# Patient Record
Sex: Female | Born: 1975 | Race: Black or African American | Hispanic: No | Marital: Married | Smoking: Never smoker
Health system: Southern US, Community
[De-identification: ages and names within clinical notes are randomized; demographics above are authoritative.]

## PROBLEM LIST (undated history)

## (undated) ENCOUNTER — Ambulatory Visit (HOSPITAL_COMMUNITY): Admission: EM | Discharge status: Absent without leave | Payer: Self-pay

## (undated) ENCOUNTER — Emergency Department (HOSPITAL_COMMUNITY): Admission: EM | Payer: Self-pay

## (undated) DIAGNOSIS — D649 Anemia, unspecified: Secondary | ICD-10-CM

## (undated) DIAGNOSIS — I10 Essential (primary) hypertension: Secondary | ICD-10-CM

## (undated) HISTORY — PX: TONSILLECTOMY: SUR1361

## (undated) HISTORY — PX: WISDOM TOOTH EXTRACTION: SHX21

## (undated) HISTORY — PX: TUBAL LIGATION: SHX77

---

## 1999-12-04 ENCOUNTER — Inpatient Hospital Stay (HOSPITAL_COMMUNITY): Admission: RE | Admit: 1999-12-04 | Discharge: 1999-12-12 | Payer: Self-pay | Admitting: *Deleted

## 1999-12-07 ENCOUNTER — Encounter: Payer: Self-pay | Admitting: *Deleted

## 1999-12-19 ENCOUNTER — Inpatient Hospital Stay (HOSPITAL_COMMUNITY): Admission: AD | Admit: 1999-12-19 | Discharge: 1999-12-27 | Payer: Self-pay | Admitting: *Deleted

## 2000-01-09 ENCOUNTER — Encounter: Payer: Self-pay | Admitting: *Deleted

## 2000-01-09 ENCOUNTER — Encounter (HOSPITAL_COMMUNITY): Admission: RE | Admit: 2000-01-09 | Discharge: 2000-03-15 | Payer: Self-pay | Admitting: *Deleted

## 2000-01-30 ENCOUNTER — Inpatient Hospital Stay (HOSPITAL_COMMUNITY): Admission: AD | Admit: 2000-01-30 | Discharge: 2000-02-06 | Payer: Self-pay | Admitting: *Deleted

## 2000-01-31 ENCOUNTER — Encounter: Payer: Self-pay | Admitting: *Deleted

## 2000-03-15 ENCOUNTER — Inpatient Hospital Stay (HOSPITAL_COMMUNITY): Admission: AD | Admit: 2000-03-15 | Discharge: 2000-04-01 | Payer: Self-pay | Admitting: Obstetrics & Gynecology

## 2000-03-15 ENCOUNTER — Encounter: Payer: Self-pay | Admitting: Obstetrics & Gynecology

## 2000-03-15 ENCOUNTER — Encounter (INDEPENDENT_AMBULATORY_CARE_PROVIDER_SITE_OTHER): Payer: Self-pay | Admitting: Specialist

## 2000-03-16 ENCOUNTER — Encounter: Payer: Self-pay | Admitting: *Deleted

## 2000-03-20 ENCOUNTER — Encounter: Payer: Self-pay | Admitting: *Deleted

## 2000-05-23 ENCOUNTER — Emergency Department (HOSPITAL_COMMUNITY): Admission: EM | Admit: 2000-05-23 | Discharge: 2000-05-23 | Payer: Self-pay | Admitting: Emergency Medicine

## 2000-06-16 ENCOUNTER — Emergency Department (HOSPITAL_COMMUNITY): Admission: EM | Admit: 2000-06-16 | Discharge: 2000-06-17 | Payer: Self-pay | Admitting: Emergency Medicine

## 2000-09-29 ENCOUNTER — Emergency Department (HOSPITAL_COMMUNITY): Admission: EM | Admit: 2000-09-29 | Discharge: 2000-09-30 | Payer: Self-pay | Admitting: Emergency Medicine

## 2000-11-11 ENCOUNTER — Encounter: Admission: RE | Admit: 2000-11-11 | Discharge: 2000-11-11 | Payer: Self-pay | Admitting: Family Medicine

## 2001-02-23 ENCOUNTER — Emergency Department (HOSPITAL_COMMUNITY): Admission: EM | Admit: 2001-02-23 | Discharge: 2001-02-23 | Payer: Self-pay | Admitting: Physical Therapy

## 2002-02-12 ENCOUNTER — Ambulatory Visit (HOSPITAL_COMMUNITY): Admission: RE | Admit: 2002-02-12 | Discharge: 2002-02-12 | Payer: Self-pay | Admitting: *Deleted

## 2002-03-28 ENCOUNTER — Inpatient Hospital Stay (HOSPITAL_COMMUNITY): Admission: AD | Admit: 2002-03-28 | Discharge: 2002-03-30 | Payer: Self-pay | Admitting: *Deleted

## 2003-03-30 ENCOUNTER — Emergency Department (HOSPITAL_COMMUNITY): Admission: EM | Admit: 2003-03-30 | Discharge: 2003-03-30 | Payer: Self-pay

## 2004-06-25 ENCOUNTER — Emergency Department (HOSPITAL_COMMUNITY): Admission: EM | Admit: 2004-06-25 | Discharge: 2004-06-25 | Payer: Self-pay

## 2006-08-14 ENCOUNTER — Ambulatory Visit (HOSPITAL_COMMUNITY): Admission: RE | Admit: 2006-08-14 | Discharge: 2006-08-14 | Payer: Self-pay | Admitting: Obstetrics and Gynecology

## 2006-08-28 ENCOUNTER — Ambulatory Visit (HOSPITAL_COMMUNITY): Admission: RE | Admit: 2006-08-28 | Discharge: 2006-08-28 | Payer: Self-pay | Admitting: Obstetrics and Gynecology

## 2006-09-21 ENCOUNTER — Emergency Department (HOSPITAL_COMMUNITY): Admission: EM | Admit: 2006-09-21 | Discharge: 2006-09-21 | Payer: Self-pay | Admitting: Emergency Medicine

## 2007-01-23 ENCOUNTER — Inpatient Hospital Stay (HOSPITAL_COMMUNITY): Admission: AD | Admit: 2007-01-23 | Discharge: 2007-01-25 | Payer: Self-pay | Admitting: Obstetrics and Gynecology

## 2007-01-23 ENCOUNTER — Ambulatory Visit: Payer: Self-pay | Admitting: *Deleted

## 2009-08-01 ENCOUNTER — Ambulatory Visit (HOSPITAL_COMMUNITY): Admission: RE | Admit: 2009-08-01 | Discharge: 2009-08-01 | Payer: Self-pay | Admitting: *Deleted

## 2009-08-25 ENCOUNTER — Ambulatory Visit: Payer: Self-pay | Admitting: Obstetrics & Gynecology

## 2009-09-06 ENCOUNTER — Ambulatory Visit (HOSPITAL_COMMUNITY): Admission: RE | Admit: 2009-09-06 | Discharge: 2009-09-06 | Payer: Self-pay | Admitting: Obstetrics & Gynecology

## 2009-09-09 ENCOUNTER — Ambulatory Visit: Payer: Self-pay | Admitting: Obstetrics & Gynecology

## 2009-10-13 ENCOUNTER — Ambulatory Visit: Payer: Self-pay | Admitting: Obstetrics & Gynecology

## 2009-10-18 ENCOUNTER — Ambulatory Visit (HOSPITAL_COMMUNITY): Admission: RE | Admit: 2009-10-18 | Discharge: 2009-10-18 | Payer: Self-pay | Admitting: Family Medicine

## 2009-11-10 ENCOUNTER — Ambulatory Visit (HOSPITAL_COMMUNITY): Admission: RE | Admit: 2009-11-10 | Discharge: 2009-11-10 | Payer: Self-pay | Admitting: Family Medicine

## 2009-12-14 ENCOUNTER — Ambulatory Visit (HOSPITAL_COMMUNITY): Admission: RE | Admit: 2009-12-14 | Discharge: 2009-12-14 | Payer: Self-pay | Admitting: Family Medicine

## 2010-01-04 ENCOUNTER — Ambulatory Visit (HOSPITAL_COMMUNITY): Admission: RE | Admit: 2010-01-04 | Discharge: 2010-01-04 | Payer: Self-pay | Admitting: Family Medicine

## 2010-01-09 ENCOUNTER — Ambulatory Visit (HOSPITAL_COMMUNITY): Admission: RE | Admit: 2010-01-09 | Discharge: 2010-01-09 | Payer: Self-pay | Admitting: Family Medicine

## 2010-01-12 ENCOUNTER — Ambulatory Visit (HOSPITAL_COMMUNITY): Admission: RE | Admit: 2010-01-12 | Discharge: 2010-01-12 | Payer: Self-pay | Admitting: Family Medicine

## 2010-01-16 ENCOUNTER — Ambulatory Visit (HOSPITAL_COMMUNITY): Admission: RE | Admit: 2010-01-16 | Discharge: 2010-01-16 | Payer: Self-pay | Admitting: Obstetrics

## 2010-01-19 ENCOUNTER — Ambulatory Visit (HOSPITAL_COMMUNITY): Admission: RE | Admit: 2010-01-19 | Discharge: 2010-01-19 | Payer: Self-pay | Admitting: Obstetrics

## 2010-01-23 ENCOUNTER — Ambulatory Visit (HOSPITAL_COMMUNITY): Admission: RE | Admit: 2010-01-23 | Discharge: 2010-01-23 | Payer: Self-pay | Admitting: Obstetrics

## 2010-01-26 ENCOUNTER — Ambulatory Visit (HOSPITAL_COMMUNITY): Admission: RE | Admit: 2010-01-26 | Discharge: 2010-01-26 | Payer: Self-pay | Admitting: Obstetrics

## 2010-01-30 ENCOUNTER — Ambulatory Visit (HOSPITAL_COMMUNITY): Admission: RE | Admit: 2010-01-30 | Discharge: 2010-01-30 | Payer: Self-pay | Admitting: Obstetrics

## 2010-02-02 ENCOUNTER — Encounter (INDEPENDENT_AMBULATORY_CARE_PROVIDER_SITE_OTHER): Payer: Self-pay | Admitting: Obstetrics

## 2010-02-02 ENCOUNTER — Inpatient Hospital Stay (HOSPITAL_COMMUNITY): Admission: RE | Admit: 2010-02-02 | Discharge: 2010-02-05 | Payer: Self-pay | Admitting: Obstetrics

## 2010-03-28 ENCOUNTER — Ambulatory Visit (HOSPITAL_COMMUNITY): Admission: RE | Admit: 2010-03-28 | Discharge: 2010-03-28 | Payer: Self-pay | Admitting: Obstetrics

## 2010-04-12 ENCOUNTER — Ambulatory Visit (HOSPITAL_COMMUNITY): Admission: RE | Admit: 2010-04-12 | Discharge: 2010-04-12 | Payer: Self-pay | Admitting: Obstetrics

## 2010-08-27 ENCOUNTER — Encounter: Payer: Self-pay | Admitting: Obstetrics

## 2010-08-27 ENCOUNTER — Encounter: Payer: Self-pay | Admitting: Family Medicine

## 2010-10-19 LAB — CBC
HCT: 39.9 % (ref 36.0–46.0)
MCHC: 33.1 g/dL (ref 30.0–36.0)
MCV: 83.5 fL (ref 78.0–100.0)

## 2010-10-19 LAB — CULTURE, ROUTINE-ABSCESS: Culture: NO GROWTH

## 2010-10-22 LAB — CBC
Platelets: 273 10*3/uL (ref 150–400)
RDW: 14.1 % (ref 11.5–15.5)
WBC: 9.6 10*3/uL (ref 4.0–10.5)

## 2010-10-23 LAB — POCT URINALYSIS DIP (DEVICE)
Glucose, UA: NEGATIVE mg/dL
Nitrite: NEGATIVE
pH: 5 (ref 5.0–8.0)

## 2010-10-23 LAB — SURGICAL PCR SCREEN: Staphylococcus aureus: NEGATIVE

## 2010-10-23 LAB — CBC
Hemoglobin: 13 g/dL (ref 12.0–15.0)
MCHC: 34 g/dL (ref 30.0–36.0)
RBC: 4.31 MIL/uL (ref 3.87–5.11)
WBC: 8.3 10*3/uL (ref 4.0–10.5)

## 2010-10-23 LAB — RPR: RPR Ser Ql: NONREACTIVE

## 2010-10-27 LAB — POCT URINALYSIS DIP (DEVICE)
Ketones, ur: NEGATIVE mg/dL
Protein, ur: 100 mg/dL — AB
Urobilinogen, UA: 0.2 mg/dL (ref 0.0–1.0)

## 2010-10-30 LAB — POCT URINALYSIS DIP (DEVICE)
Glucose, UA: NEGATIVE mg/dL
Nitrite: NEGATIVE
Specific Gravity, Urine: >=1.03 (ref 1.005–1.030)
Urobilinogen, UA: 0.2 mg/dL (ref 0.0–1.0)

## 2010-11-02 ENCOUNTER — Emergency Department (HOSPITAL_COMMUNITY)
Admission: EM | Admit: 2010-11-02 | Discharge: 2010-11-03 | Disposition: A | Discharge status: Home / Self Care | Payer: Medicaid Other | Attending: Emergency Medicine | Admitting: Emergency Medicine

## 2010-11-02 DIAGNOSIS — H9209 Otalgia, unspecified ear: Secondary | ICD-10-CM | POA: Insufficient documentation

## 2010-11-02 DIAGNOSIS — J020 Streptococcal pharyngitis: Secondary | ICD-10-CM | POA: Insufficient documentation

## 2010-11-02 DIAGNOSIS — R070 Pain in throat: Secondary | ICD-10-CM | POA: Insufficient documentation

## 2010-11-02 LAB — RAPID STREP SCREEN (MED CTR MEBANE ONLY): Streptococcus, Group A Screen (Direct): POSITIVE — AB

## 2010-11-23 IMAGING — US US OB LIMITED
1 series · 14 of 18 positions shown · non-contrast
Comparison: none

OBSTETRICAL ULTRASOUND:
 This ultrasound was performed in The [HOSPITAL], and the [facility] report will be stored to [REDACTED] PACS.  This report is also available in [HOSPITAL]?s accessANYware.

[Series 1: us ob limited · 14 of 18 slices shown]
[im 1/18]
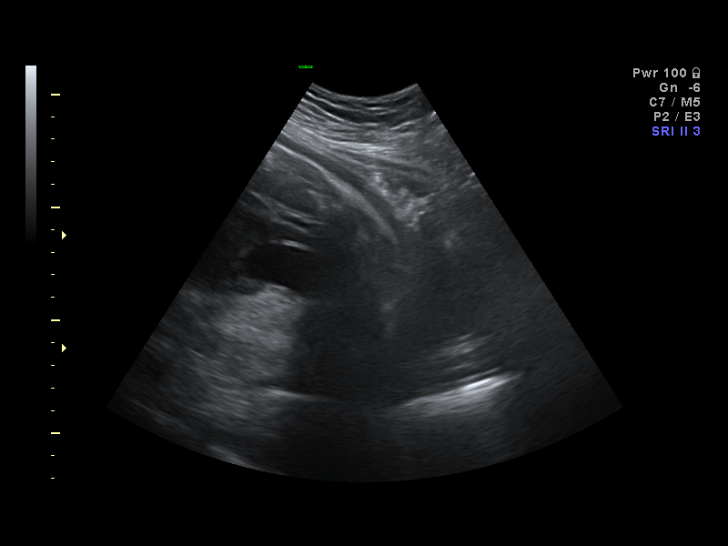
[im 2/18]
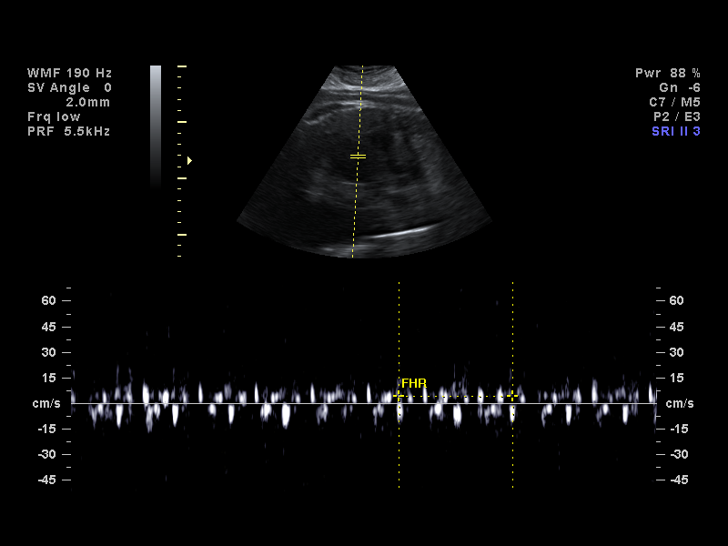
[im 4/18]
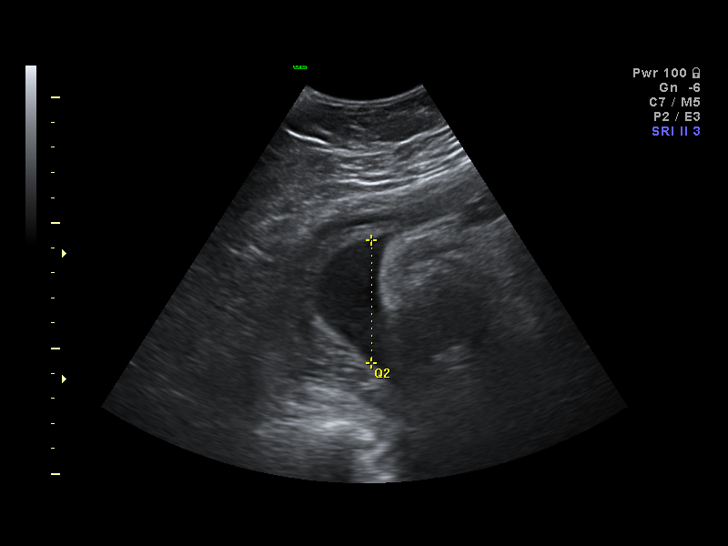
[im 5/18]
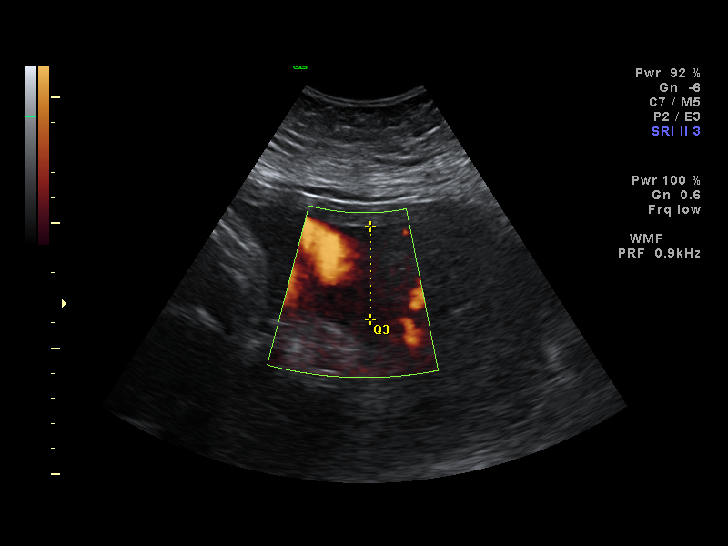
[im 6/18]
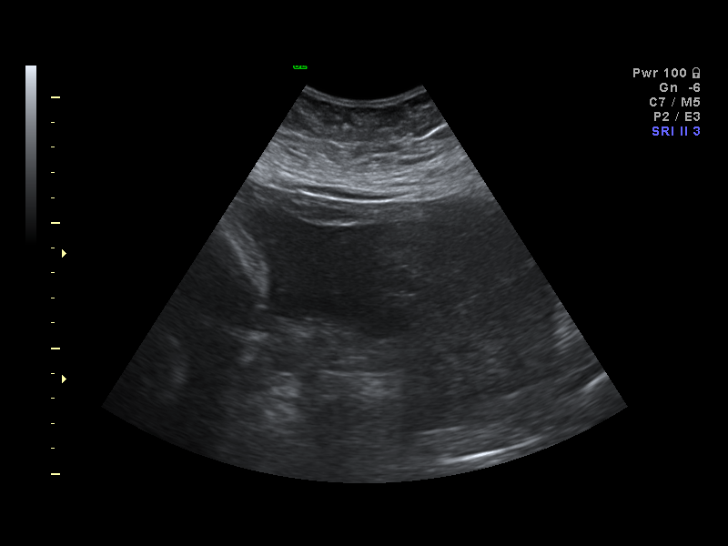
[im 8/18]
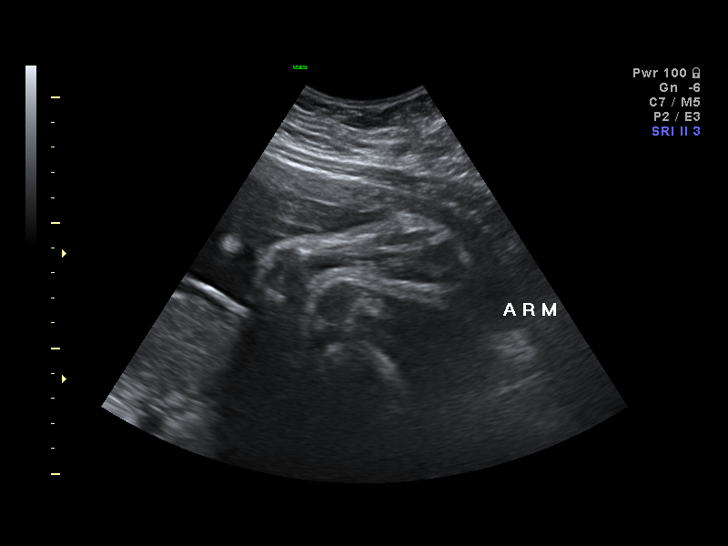
[im 9/18]
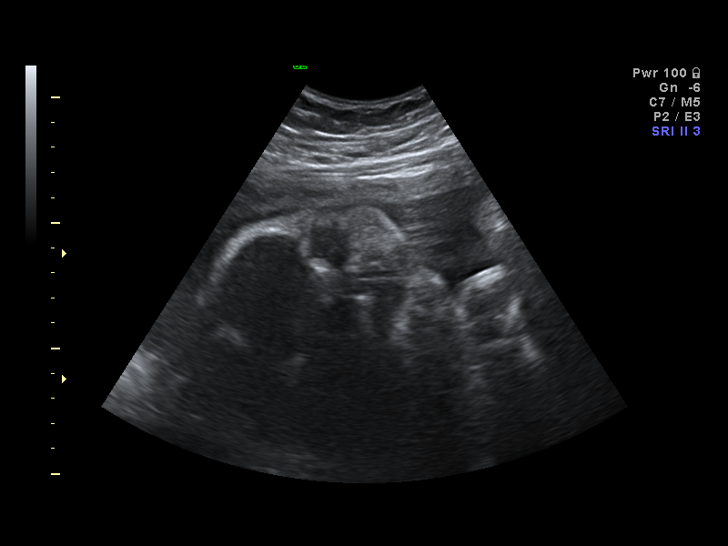
[im 10/18]
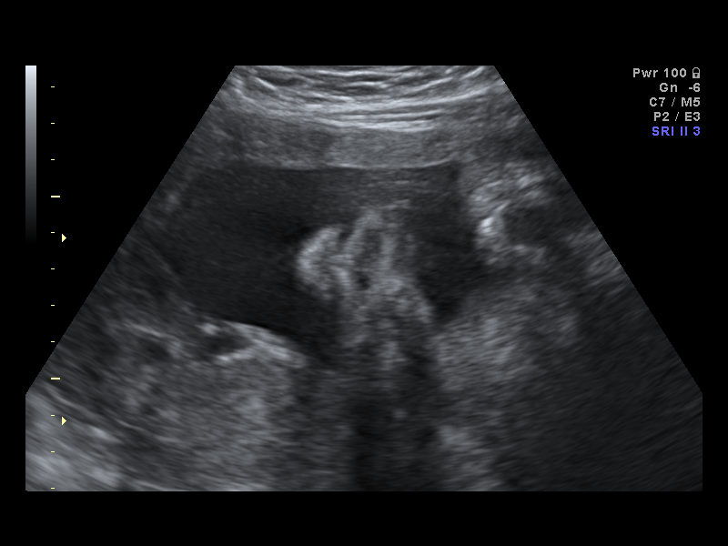
[im 11/18]
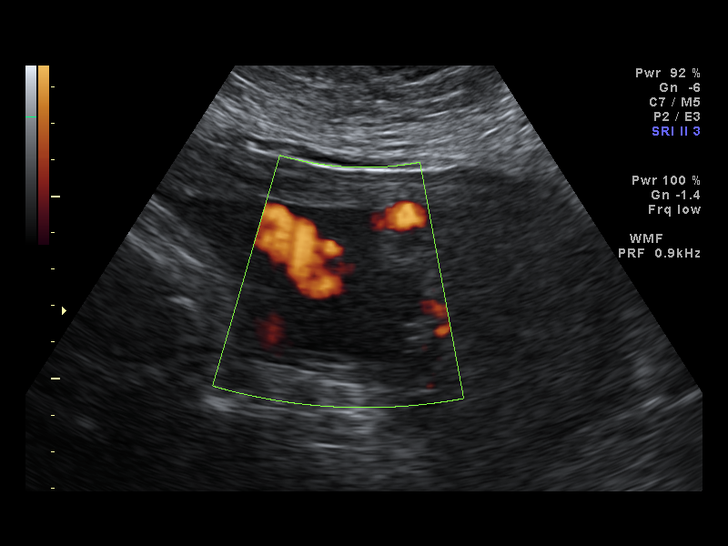
[im 13/18]
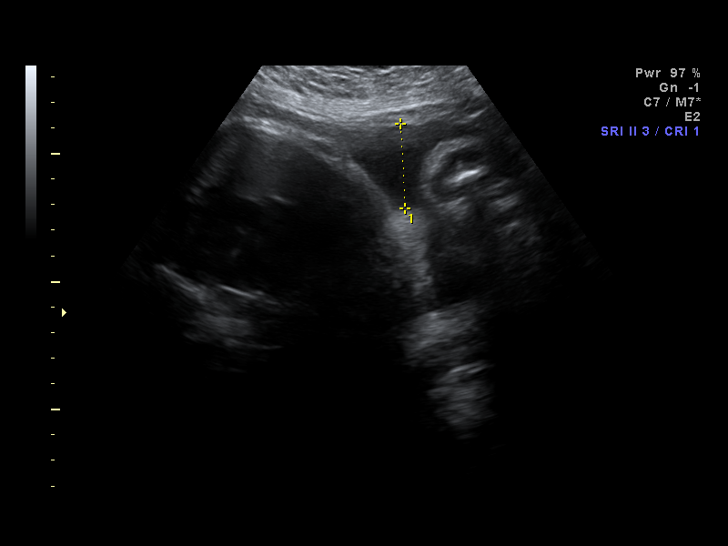
[im 14/18]
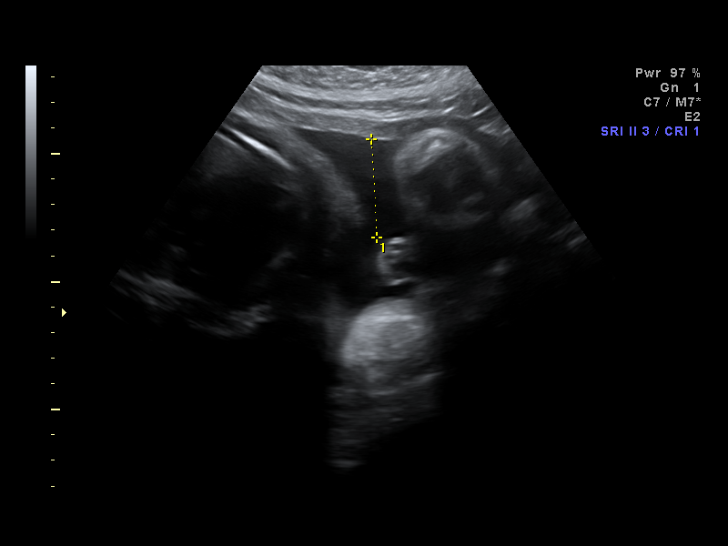
[im 15/18]
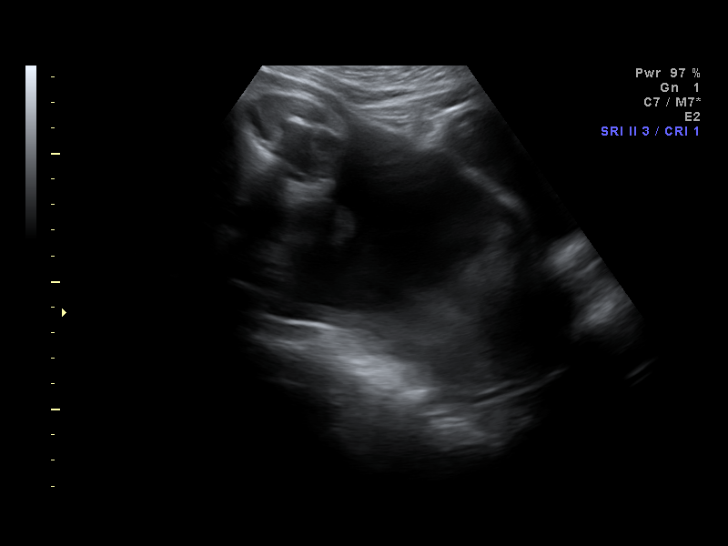
[im 17/18]
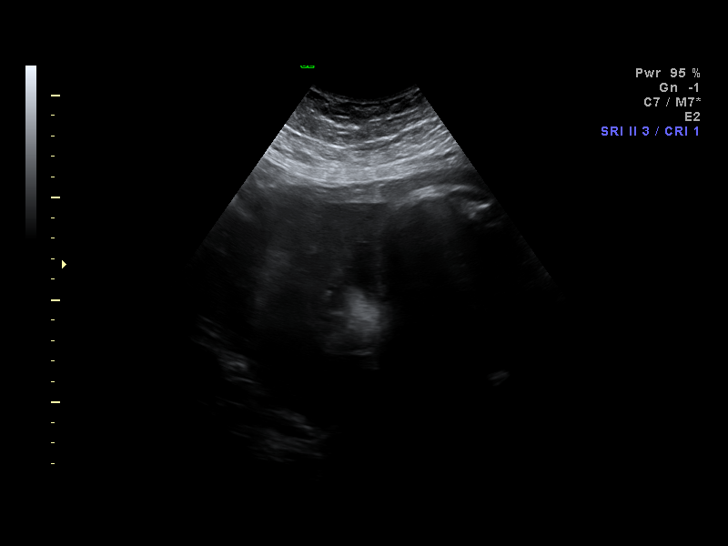
[im 18/18]
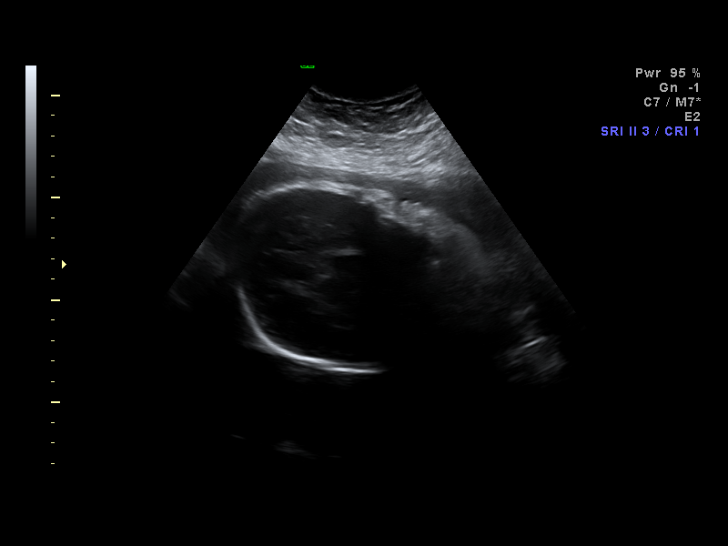

[14 of 18 positions shown; findings below may reference images not displayed]

IMPRESSION: [facility] has also been faxed to the ordering physician.

## 2010-12-22 NOTE — Discharge Summary (Signed)
[facility]   Patient:    Allison Dennis, Allison Dennis                       MRN: [id number] Adm. Date:  [id number] Disc. Date: [id number] Attending:  Antionette Char Dictator:   Cleotis Lema, M.D.                           Discharge Summary  ADMISSION DIAGNOSES:          1. Intrauterine pregnancy 33 weeks 6 days twin                                  gestation.                               2. Spontaneous rupture of membranes.  DISCHARGE DIAGNOSES:          1. Intrauterine pregnancy 33 weeks 6 days twin                                  gestation.                               2. Spontaneous rupture of membranes.                               3. Successful induction of labor and delivery of                                  viable twin males.  FELLOW:                       Jamey Reas, M.D.  CONSULTS:                     None.  PROCEDURE:                    None.  HISTORY AND PHYSICAL:         Briefly, this was a 35 year old African-American female G1, P0-0-0 with a 33 week 6 day intrauterine twin pregnancy which was dated by a 19 week ultrasound.  She presented with spontaneous rupture of membranes.  She denied contractions, bleeding, or discharge and reported normal fetal movement.                                The patient had a history of cerclage placement on Dec 21, 1999 for short/incompetent cervix.  Reexamination of the patient on July 6 showed a closed cervix with a stitch intact.  Also, the patient had a history during this pregnancy of preterm labor and had been on home terbutaline medication.  PHYSICAL EXAMINATION:  VITAL SIGNS:                  Temperature 97.8.  Blood pressure 141/84.  Pulse 105.  CARDIOVASCULAR:               Unremarkable.  EXTREMITIES:  DTRs 2/2 and symmetrical on both lower extremities.  Edema 1+.  No clonus.  PELVIC:                       The cerclage was removed in triage.  Fetal  heart tones were in the 150s.  Uterine monitoring revealed uterine irritability.                                An ultrasound at 24 weeks 5 days showed twin A in the forty-fifth percentile and twin B in the twenty-third percentile for growth.  Also note this patient was group B strep positive and was rubella non-immune.  On admission the patient was placed on Unasyn, placed on continuous external fetal monitoring and uterine monitoring.  The plan was for expectant management.  Once admitted to the hospital, the patient had no significant problems.  However, there were several occasions during which the twins showed somewhat worrisome fetal heart tracings.  However, biophysical profile performed in these situations were reactive and reassuring. Therefore, expectant management was continued and the patient was continued on Unasyn through a seven day course.  On March 25, 2000 it was decided that the gestation was mature enough to induce labor.  An ultrasound at that time showed twin A vertex and twin B oblique.  Attempts at induction were started with Cytotec but the patient showed minimal cervical change in response to this.  However, eventually a favorable cervix was obtained and low dose Pitocin was started.  Intrauterine pressure catheter and fetal scalp electrodes were instituted.  On March 30, 2000 at 1610 twin A was delivered spontaneously and was a viable female with Apgars of 8/1 and 8/5.  At 1944 twin B was delivered spontaneously and Apgars were 8/1 and 8/5.  Infants were handed to waiting NICU team.  Perineal structures were explored and no lacerations were found.  The infants were taken to the central nursery and the patient was taken to the floor in stable condition.  Postoperative course was uncomplicated.  Routine postpartum care assisted the patient in recovering well.  The mother was discharged home with both infants on postpartum day #2. She planned to bottle feed.  She  received Depo-Provera for contraception.  She was instructed to [facility]  She was instructed to use ibuprofen 600 mg every six hours as needed for pain.  LABORATORIES:                 On April 01, 2000 hemoglobin was 10.1, hematocrit 29.5.  On March 20, 2000 biophysical profile on twin A was 8/8 with amniotic fluid pocket of 2.7 cm.  Biophysical profile on twin B showed a score of 8/8 with an amniotic fluid pocket of 4.4 cm. DD:  05/18/00 TD:  05/19/00 Job: [id number]

## 2010-12-22 NOTE — Discharge Summary (Signed)
[facility] [place]  Patient:    Allison Dennis, Allison Dennis                       MRN: [id number] Adm. Date:  [id number] Disc. Date: [id number] Attending:  Lorre Nick Dictator:   Delano Metz, M.D.                           Discharge Summary  HISTORY OF PRESENT ILLNESS:   The patient was a 35 year old, G1, P0, with a twin intrauterine pregnancy who presented with preterm labor.  HOSPITAL COURSE:              She was placed on complete bed rest. On Dec 21, 1999, a [employer] cerclage was placed by Dr. Tamela Oddi. The patient was given IV Unasyn during the hospitalization, six days total. She was sent home on bed rest; also, to take Augmentin 785 mg p.o. b.i.d.  At discharge, fetal heart tones were heard and the patient was afebrile without any abdominal pain.  DISCHARGE MEDICATIONS:        Augmentin 875 mg p.o. b.i.d.  ACTIVITY:                     Bed rest. DD:  08/27/00 TD:  08/27/00 Job: [id number]

## 2010-12-22 NOTE — Discharge Summary (Signed)
[facility] [place]  Patient:    Allison Dennis, Allison Dennis                       MRN: [id number] Adm. Date:  [id number] Disc. Date: [id number] Attending:  Antionette Char Dictator:   Kevin Fenton, M.D.                           Discharge Summary  RESIDENT PHYSICIAN:           Dr. Coralee Rud.  ADMISSION HISTORY:            This is a 35 year old African-American female, G1, at 16 and 6/7 weeks with a twin gestation and cervical weakness who is status post  cerclage at 21 weeks.  The patient was being followed on strict bed rest at home with home uterine activity monitoring as well as Cleocin douches for seven days and amoxicillin for seven days alternately.  First trimester she had BV Chlamydia and was found to be group B strep positive.  Her cervix was short, 1-2 cm on previous scans and was found on ultrasound to have cervix of 1.2 mm on ultrasound with funneling and dynamic changes which prompted the admission for IV antibiotics and tocolysis.  HOSPITAL COURSE:              The patient was admitted, placed on terbutaline and Unasyn.  A 24-hour urine was obtained secondary to isolated proteinuria.  The patient had negative PIH labs.  The patient was discharged home on bed rest, proteinuria still unexplained, but no signs or symptoms of preeclampsia or HELLP, and labs otherwise normal.  The patient was to go home on bed rest and to return to the hospital on July 6 for ultrasound and cervical check. DD:  05/22/00 TD:  05/22/00 Job: [id number]

## 2010-12-22 NOTE — Op Note (Signed)
[facility] [place]  Patient:    Allison Dennis, Allison Dennis                       MRN: [id number] Proc. Date: 12/21/99 Adm. Date:  [id number] Attending:  Michaelle Copas CC:         [facility]                           Operative Report  PREOPERATIVE DIAGNOSIS:       Twin gestation at 21+ weeks with cervical weakness.  POSTOPERATIVE DIAGNOSIS:      Twin gestation at 21+ weeks with cervical weakness.  OPERATION:                    [employer] cerclage.  SURGEON:                      Roseanna Rainbow, M.D.  ASSISTANT:  ANESTHESIA:                   Spinal.  COMPLICATIONS:                None.  ESTIMATED BLOOD LOSS:         Less than 50 cc.  INDICATIONS:                  The patient is a 35 year old, gravida 1, para 0, ith twin gestation at 21+ weeks with a weak cervix, status post attempt at bed rest. Findings on physical examination prior to the procedure, the lower uterine segment was slightly developed with the functional cervix measuring about 1 to 1.5 cm. The external os was fingertip.  DESCRIPTION OF PROCEDURE:     The patient was taken to the operating room and spinal anesthetic was administered.  The patient was then placed in the dorsal lithotomy position and prepped and draped in the usual sterile fashion.  A weighted speculum was placed in the vagina.  A sponge forceps was then used to grasp the  cervix at 12 oclock.  A double #4 silk suture was then used to place a pursestring suture around the cervix.  The first bite was taken at 12 oclock at the junction of the vaginal mucosa and portio of the cervix.  Approximately four to six bites again were taken in a pursestring fashion.  The suture was then tied at 12 oclock. The vagina was then irrigated and a Clindamycin solution was then used for the final irrigation fluid.  At the close of the procedure, the instrument and pack  counts were said to be correct x 2.  The patient  was taken to the PACU in stable condition. DD:  12/22/99 TD:  12/24/99 Job: [id number]

## 2011-02-06 IMAGING — US US PELVIS COMPLETE
1 series · 14 of 23 positions shown · non-contrast
Comparison: None available.

CLINICAL DATA: Mass at C-section incision

TRANSABDOMINAL ULTRASOUND OF PELVIS
TECHNIQUE: Transabdominal ultrasound examination of the pelvis was
performed including evaluation of the uterus, ovaries, adnexal
regions, and pelvic cul-de-sac.

[Series 1: us pelvis complete · 0.09mm/px · 23 acquisitions, 14 frames shown]
[im 1/23]
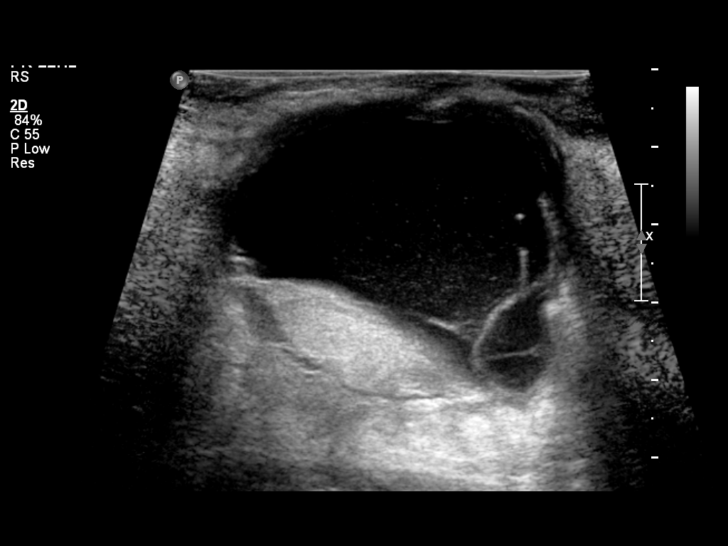
[im 3/23]
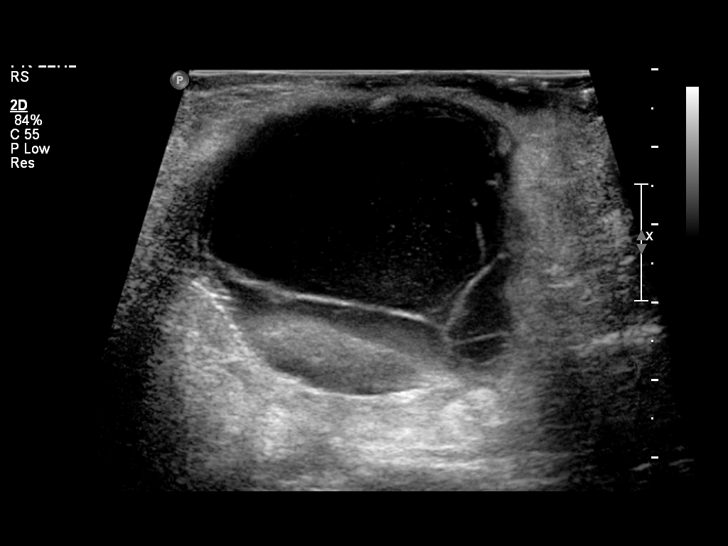
[im 5/23]
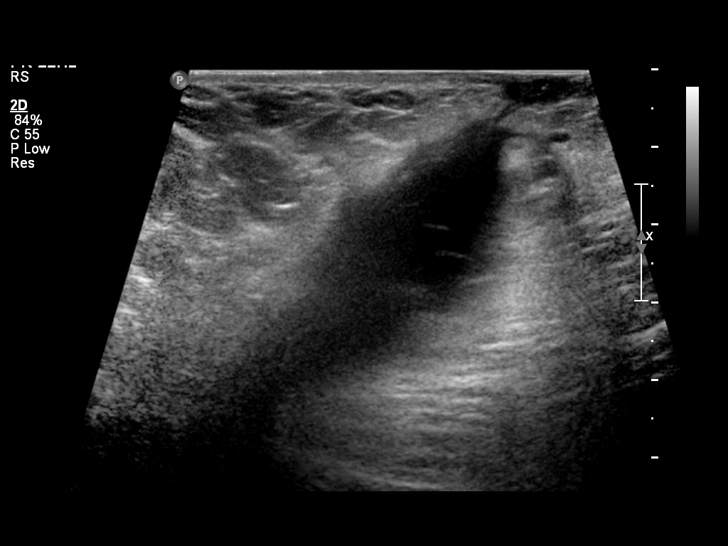
[im 6/23]
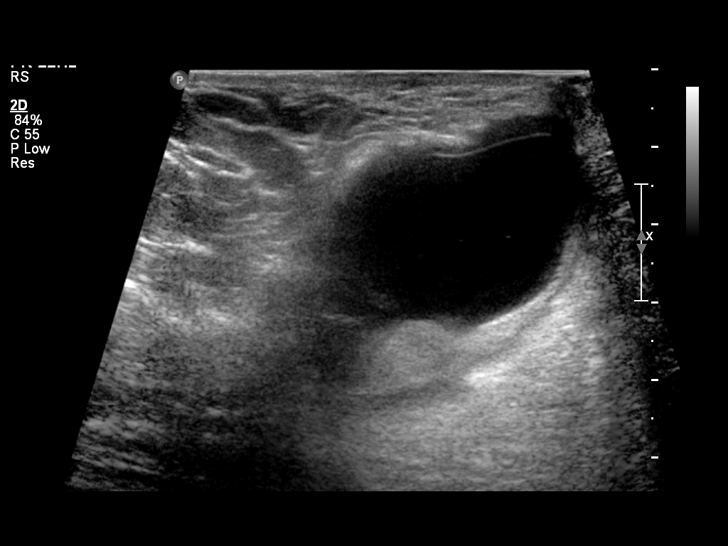
[im 8/23]
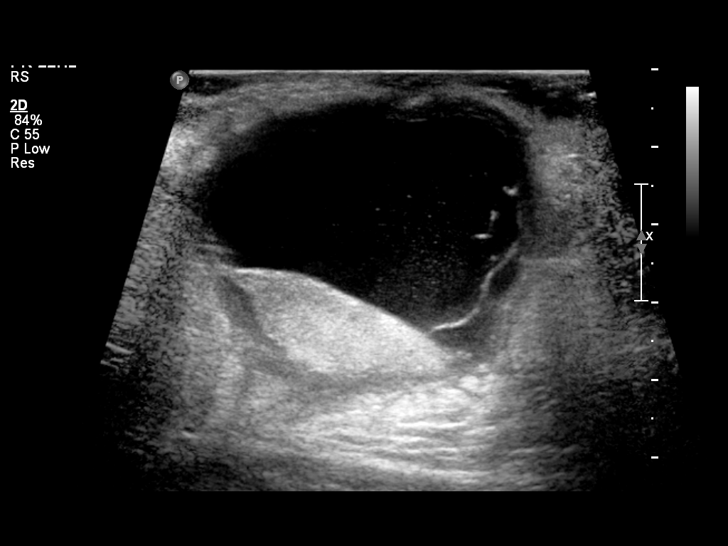
[im 10/23]
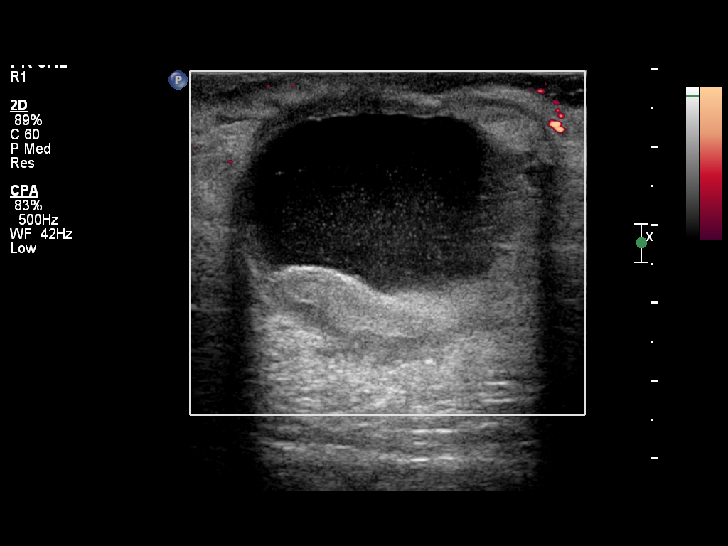
[im 11/23]
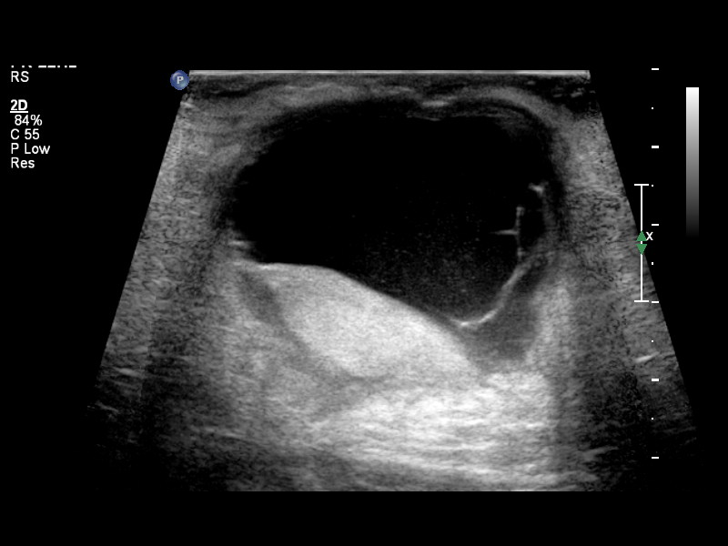
[im 13/23]
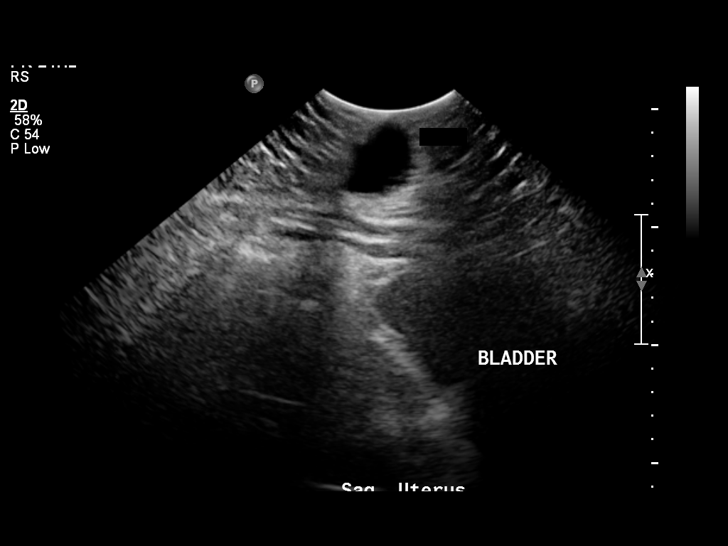
[im 14/23]
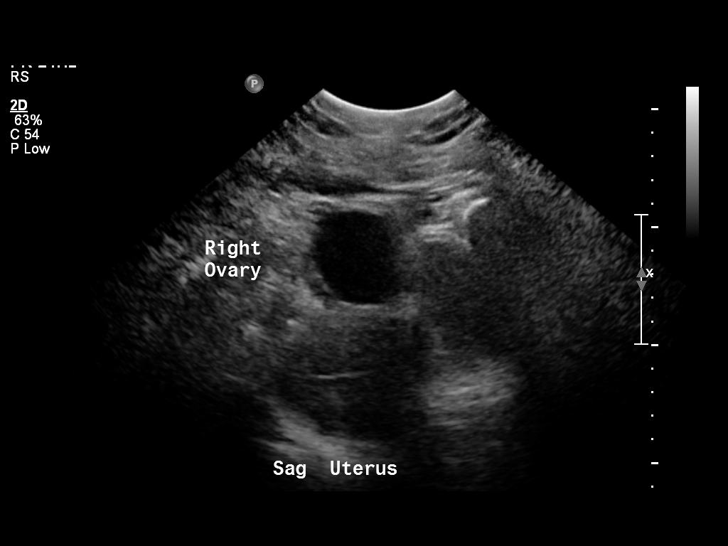
[im 16/23]
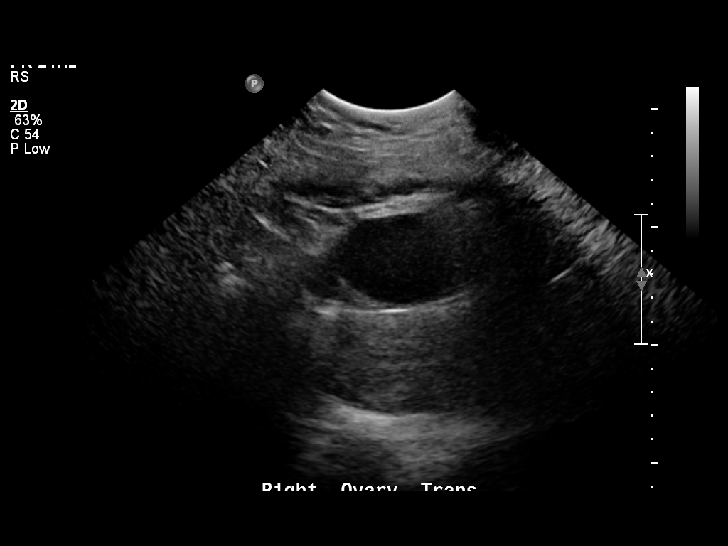
[im 18/23]
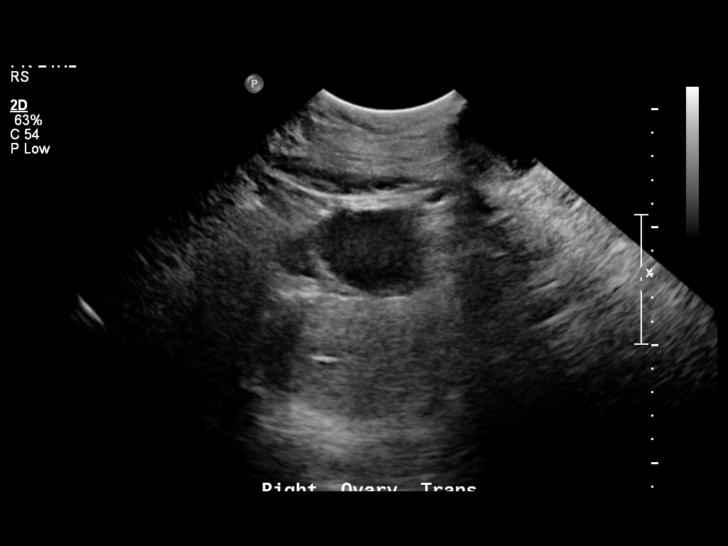
[im 19/23]
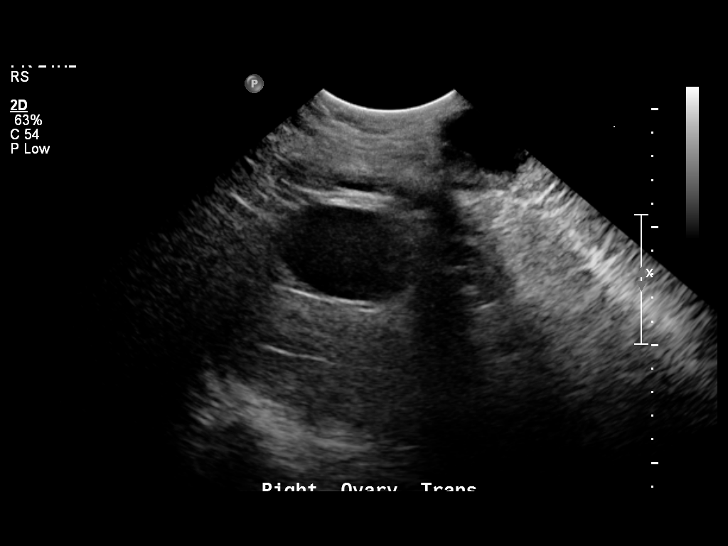
[im 21/23]
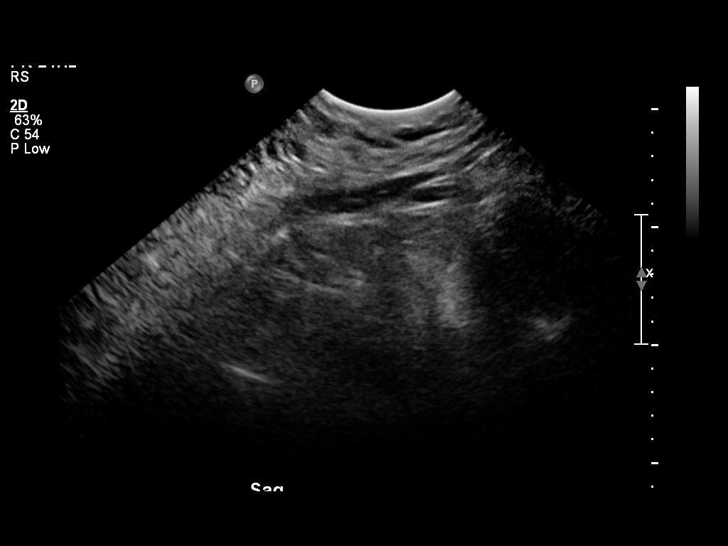
[im 23/23]
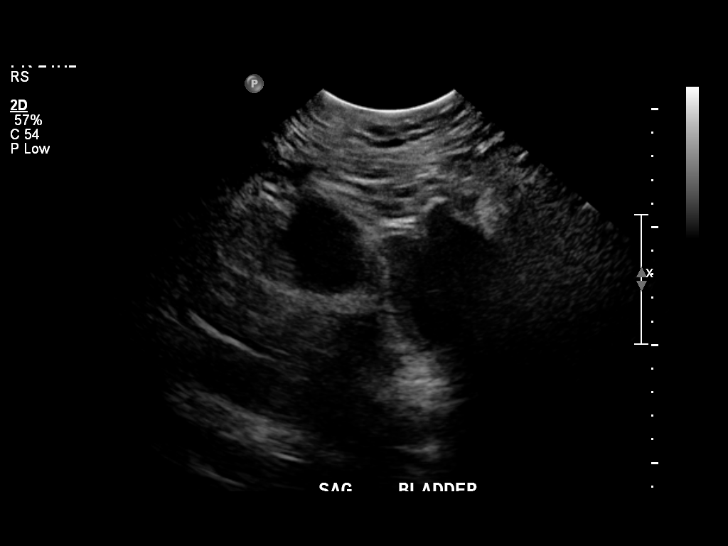

[14 of 23 positions shown; findings below may reference images not displayed]

FINDINGS: There is a 4.5 cm mixed echogenicity fluid collection
with dependent layering echogenic debris located at the level of
the C-section scar. The majority of the collection is simple fluid.

The right ovary is visualized and has a normal size and appearance,
measuring 4.5 x 4.2 x 5.5 cm.  The  uterus is underlying the
complex fluid collection and is difficult to visualize but
generally unremarkable in size and appearance.  The left ovary is
not seen.  There is no free pelvic fluid.
IMPRESSION: There is a 4.5 cm postoperative seroma at the level of the C-
section scar.

## 2011-02-16 ENCOUNTER — Inpatient Hospital Stay (INDEPENDENT_AMBULATORY_CARE_PROVIDER_SITE_OTHER)
Admission: RE | Admit: 2011-02-16 | Discharge: 2011-02-16 | Disposition: A | Discharge status: Home / Self Care | Payer: Medicaid Other | Source: Ambulatory Visit | Attending: Family Medicine | Admitting: Family Medicine

## 2011-02-16 DIAGNOSIS — R109 Unspecified abdominal pain: Secondary | ICD-10-CM

## 2011-02-16 DIAGNOSIS — N390 Urinary tract infection, site not specified: Secondary | ICD-10-CM

## 2011-02-16 LAB — POCT I-STAT, CHEM 8
Hemoglobin: 13.6 g/dL (ref 12.0–15.0)
Sodium: 139 mEq/L (ref 135–145)
TCO2: 25 mmol/L (ref 0–100)

## 2011-02-16 LAB — POCT URINALYSIS DIP (DEVICE)
Glucose, UA: NEGATIVE mg/dL
Nitrite: POSITIVE — AB
Urobilinogen, UA: 0.2 mg/dL (ref 0.0–1.0)

## 2011-02-16 LAB — WET PREP, GENITAL
Trich, Wet Prep: NONE SEEN
Yeast Wet Prep HPF POC: NONE SEEN

## 2011-02-16 LAB — POCT PREGNANCY, URINE: Preg Test, Ur: NEGATIVE

## 2011-02-18 LAB — URINE CULTURE
Colony Count: >=100000
Culture  Setup Time: 201207131313

## 2011-05-23 LAB — CBC
HCT: 39.3
MCHC: 33.9
Platelets: 305
RDW: 14.7 — ABNORMAL HIGH

## 2012-02-21 ENCOUNTER — Emergency Department (HOSPITAL_COMMUNITY): Payer: No Typology Code available for payment source

## 2012-02-21 ENCOUNTER — Emergency Department (HOSPITAL_COMMUNITY)
Admission: EM | Admit: 2012-02-21 | Discharge: 2012-02-21 | Disposition: A | Discharge status: Home / Self Care | Payer: No Typology Code available for payment source | Attending: Emergency Medicine | Admitting: Emergency Medicine

## 2012-02-21 ENCOUNTER — Encounter (HOSPITAL_COMMUNITY): Payer: Self-pay | Admitting: *Deleted

## 2012-02-21 DIAGNOSIS — M79609 Pain in unspecified limb: Secondary | ICD-10-CM | POA: Insufficient documentation

## 2012-02-21 DIAGNOSIS — R079 Chest pain, unspecified: Secondary | ICD-10-CM | POA: Insufficient documentation

## 2012-02-21 DIAGNOSIS — M542 Cervicalgia: Secondary | ICD-10-CM | POA: Insufficient documentation

## 2012-02-21 DIAGNOSIS — M545 Low back pain, unspecified: Secondary | ICD-10-CM | POA: Insufficient documentation

## 2012-02-21 DIAGNOSIS — S161XXA Strain of muscle, fascia and tendon at neck level, initial encounter: Secondary | ICD-10-CM

## 2012-02-21 DIAGNOSIS — S139XXA Sprain of joints and ligaments of unspecified parts of neck, initial encounter: Secondary | ICD-10-CM | POA: Insufficient documentation

## 2012-02-21 DIAGNOSIS — Z79899 Other long term (current) drug therapy: Secondary | ICD-10-CM | POA: Insufficient documentation

## 2012-02-21 DIAGNOSIS — I10 Essential (primary) hypertension: Secondary | ICD-10-CM | POA: Insufficient documentation

## 2012-02-21 DIAGNOSIS — IMO0002 Reserved for concepts with insufficient information to code with codable children: Secondary | ICD-10-CM | POA: Insufficient documentation

## 2012-02-21 DIAGNOSIS — S20219A Contusion of unspecified front wall of thorax, initial encounter: Secondary | ICD-10-CM | POA: Insufficient documentation

## 2012-02-21 DIAGNOSIS — S39012A Strain of muscle, fascia and tendon of lower back, initial encounter: Secondary | ICD-10-CM

## 2012-02-21 HISTORY — DX: Essential (primary) hypertension: I10

## 2012-02-21 MED ORDER — HYDROCODONE-ACETAMINOPHEN 5-500 MG PO TABS: 1.0000 | ORAL_TABLET | Freq: Four times a day (QID) | ORAL | Status: AC | PRN

## 2012-02-21 MED ORDER — OXYCODONE-ACETAMINOPHEN 5-325 MG PO TABS
2.0000 | ORAL_TABLET | Freq: Once | ORAL | Status: AC
  Administered 2012-02-21: 2 via ORAL
  Filled 2012-02-21: qty 2
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
## 2012-02-22 ENCOUNTER — Other Ambulatory Visit: Payer: Self-pay | Admitting: Specialist

## 2012-02-22 DIAGNOSIS — R102 Pelvic and perineal pain: Secondary | ICD-10-CM

## 2012-02-22 DIAGNOSIS — N920 Excessive and frequent menstruation with regular cycle: Secondary | ICD-10-CM

## 2012-02-26 ENCOUNTER — Other Ambulatory Visit: Payer: No Typology Code available for payment source

## 2012-02-27 ENCOUNTER — Other Ambulatory Visit: Payer: No Typology Code available for payment source

## 2012-05-09 ENCOUNTER — Inpatient Hospital Stay: Admission: RE | Admit: 2012-05-09 | Payer: Medicaid Other | Source: Ambulatory Visit

## 2012-05-09 ENCOUNTER — Other Ambulatory Visit: Payer: Medicaid Other

## 2012-12-07 ENCOUNTER — Encounter (HOSPITAL_COMMUNITY): Payer: Self-pay | Admitting: Emergency Medicine

## 2012-12-07 ENCOUNTER — Emergency Department (HOSPITAL_COMMUNITY)
Admission: EM | Admit: 2012-12-07 | Discharge: 2012-12-07 | Disposition: A | Discharge status: Home / Self Care | Payer: No Typology Code available for payment source | Attending: Emergency Medicine | Admitting: Emergency Medicine

## 2012-12-07 ENCOUNTER — Emergency Department (HOSPITAL_COMMUNITY): Payer: No Typology Code available for payment source

## 2012-12-07 DIAGNOSIS — S59909A Unspecified injury of unspecified elbow, initial encounter: Secondary | ICD-10-CM | POA: Diagnosis not present

## 2012-12-07 DIAGNOSIS — Y92410 Unspecified street and highway as the place of occurrence of the external cause: Secondary | ICD-10-CM | POA: Diagnosis not present

## 2012-12-07 DIAGNOSIS — Z79899 Other long term (current) drug therapy: Secondary | ICD-10-CM | POA: Insufficient documentation

## 2012-12-07 DIAGNOSIS — S6990XA Unspecified injury of unspecified wrist, hand and finger(s), initial encounter: Secondary | ICD-10-CM | POA: Insufficient documentation

## 2012-12-07 DIAGNOSIS — I10 Essential (primary) hypertension: Secondary | ICD-10-CM | POA: Diagnosis not present

## 2012-12-07 DIAGNOSIS — Y9389 Activity, other specified: Secondary | ICD-10-CM | POA: Insufficient documentation

## 2012-12-07 DIAGNOSIS — S59919A Unspecified injury of unspecified forearm, initial encounter: Secondary | ICD-10-CM | POA: Diagnosis not present

## 2012-12-07 MED ORDER — NAPROXEN 500 MG PO TABS: 500.0000 mg | ORAL_TABLET | Freq: Two times a day (BID) | ORAL | Status: DC

## 2012-12-07 MED ORDER — HYDROCODONE-ACETAMINOPHEN 5-325 MG PO TABS: 1.0000 | ORAL_TABLET | Freq: Four times a day (QID) | ORAL | Status: DC | PRN

## 2012-12-07 NOTE — ED Provider Notes (Signed)
Medical screening examination/treatment/procedure(s) were performed by non-physician practitioner and as supervising physician I was immediately available for consultation/collaboration.  Alyss Granato, MD 12/07/12 0756 
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
## 2012-12-07 NOTE — ED Notes (Signed)
Pt dc to home. Pt sts understanding to dc instructions. Pt ambulatory to exit without difficulty. 

## 2012-12-19 ENCOUNTER — Emergency Department (HOSPITAL_COMMUNITY): Payer: No Typology Code available for payment source

## 2012-12-19 ENCOUNTER — Encounter (HOSPITAL_COMMUNITY): Payer: Self-pay | Admitting: Cardiology

## 2012-12-19 ENCOUNTER — Emergency Department (HOSPITAL_COMMUNITY)
Admission: EM | Admit: 2012-12-19 | Discharge: 2012-12-19 | Disposition: A | Discharge status: Home / Self Care | Payer: No Typology Code available for payment source | Attending: Emergency Medicine | Admitting: Emergency Medicine

## 2012-12-19 DIAGNOSIS — M542 Cervicalgia: Secondary | ICD-10-CM | POA: Insufficient documentation

## 2012-12-19 DIAGNOSIS — I10 Essential (primary) hypertension: Secondary | ICD-10-CM | POA: Insufficient documentation

## 2012-12-19 DIAGNOSIS — M79602 Pain in left arm: Secondary | ICD-10-CM

## 2012-12-19 DIAGNOSIS — Z87828 Personal history of other (healed) physical injury and trauma: Secondary | ICD-10-CM | POA: Insufficient documentation

## 2012-12-19 DIAGNOSIS — Z3202 Encounter for pregnancy test, result negative: Secondary | ICD-10-CM | POA: Insufficient documentation

## 2012-12-19 DIAGNOSIS — M7989 Other specified soft tissue disorders: Secondary | ICD-10-CM | POA: Insufficient documentation

## 2012-12-19 DIAGNOSIS — M79609 Pain in unspecified limb: Secondary | ICD-10-CM

## 2012-12-19 DIAGNOSIS — N898 Other specified noninflammatory disorders of vagina: Secondary | ICD-10-CM | POA: Insufficient documentation

## 2012-12-19 DIAGNOSIS — M25539 Pain in unspecified wrist: Secondary | ICD-10-CM | POA: Insufficient documentation

## 2012-12-19 LAB — URINE MICROSCOPIC-ADD ON

## 2012-12-19 LAB — URINALYSIS, ROUTINE W REFLEX MICROSCOPIC
Bilirubin Urine: NEGATIVE
Hgb urine dipstick: NEGATIVE
Ketones, ur: NEGATIVE mg/dL
Specific Gravity, Urine: 1.025 (ref 1.005–1.030)
pH: 7 (ref 5.0–8.0)

## 2012-12-19 LAB — WET PREP, GENITAL: Yeast Wet Prep HPF POC: NONE SEEN

## 2012-12-19 NOTE — Progress Notes (Signed)
VASCULAR LAB PRELIMINARY  PRELIMINARY  PRELIMINARY  PRELIMINARY  Left upper extremity venous duplex completed.    Preliminary report:  Left:  No evidence of DVT or superficial thrombosis.    Rayane Gallardo, RVT 12/19/2012, 6:44 PM

## 2012-12-19 NOTE — ED Notes (Signed)
Pt sitting up on stretcher eating [employer] fri[employer]. Rated pain as 7
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
## 2012-12-19 NOTE — ED Notes (Signed)
Doppler study competed

## 2012-12-20 LAB — GC/CHLAMYDIA PROBE AMP
CT Probe RNA: NEGATIVE
GC Probe RNA: NEGATIVE

## 2012-12-20 NOTE — ED Provider Notes (Signed)
Medical screening examination/treatment/procedure(s) were conducted as a shared visit with non-physician practitioner(s) and myself.  I personally evaluated the patient during the encounter.  Doppler study of left upper extremity reveals no clot. Patient alert and oriented x3 with no neurological deficits.  Films of cervical spine and left elbow negative for fracture  Donnetta Hutching, MD 12/20/12 1134

## 2012-12-21 LAB — URINE CULTURE

## 2012-12-22 NOTE — ED Notes (Signed)
Post ED Visit - Positive Culture Follow-up  Culture report reviewed by antimicrobial stewardship pharmacist: []  Wes Dulaney, Pharm.D., BCPS []  Celedonio Miyamoto, Pharm.D., BCPS []  Georgina Pillion, [address].D., BCPS [x]  [place], [place].D., BCPS, AAHIVP []  Estella Husk, Pharm.D., BCPS, AAHIV  Positive urine culture   no further patient follow-up is required at this time.  Larena Sox 12/22/2012, 6:12 PM

## 2012-12-31 ENCOUNTER — Encounter: Payer: Self-pay | Admitting: Obstetrics

## 2012-12-31 ENCOUNTER — Ambulatory Visit (INDEPENDENT_AMBULATORY_CARE_PROVIDER_SITE_OTHER): Payer: Medicaid Other | Admitting: Obstetrics

## 2012-12-31 VITALS — BP 143/91 | HR 81 | Temp 98.7°F | Ht 65.0 in | Wt 310.0 lb

## 2012-12-31 DIAGNOSIS — Z0000 Encounter for general adult medical examination without abnormal findings: Secondary | ICD-10-CM

## 2012-12-31 DIAGNOSIS — N920 Excessive and frequent menstruation with regular cycle: Secondary | ICD-10-CM | POA: Insufficient documentation

## 2012-12-31 DIAGNOSIS — N390 Urinary tract infection, site not specified: Secondary | ICD-10-CM

## 2012-12-31 DIAGNOSIS — Z1239 Encounter for other screening for malignant neoplasm of breast: Secondary | ICD-10-CM

## 2012-12-31 LAB — COMPREHENSIVE METABOLIC PANEL
ALT: 13 U/L (ref 0–35)
Alkaline Phosphatase: 103 U/L (ref 39–117)
CO2: 27 mEq/L (ref 19–32)
Sodium: 138 mEq/L (ref 135–145)
Total Bilirubin: 0.4 mg/dL (ref 0.3–1.2)
Total Protein: 7.2 g/dL (ref 6.0–8.3)

## 2012-12-31 LAB — TSH: TSH: 1.898 u[IU]/mL (ref 0.350–4.500)

## 2012-12-31 MED ORDER — CEPHALEXIN 500 MG PO CAPS: ORAL_CAPSULE | ORAL | Status: DC

## 2012-12-31 NOTE — Progress Notes (Signed)
Subjective:     Allison Dennis is a 38 y.o. female here for a routine exam.  Current complaints: vaginal discharge. Pt states she is having itching and irritation. Pt states she has been having this irritation for about 1 month. Pt states she went to the emergency room last week and was informed that there was nothing going on.   Personal health questionnaire reviewed: yes.   Gynecologic History Patient's last menstrual period was 12/28/2012. Contraception: tubal ligation Last Pap: 2012. Results were: normal Last mammogram: n/a. Results were: n/a  Obstetric History OB History   Grav Para Term Preterm Abortions TAB SAB Ect Mult Living                   The following portions of the patient's history were reviewed and updated as appropriate: allergies, current medications, past family history, past medical history, past social history, past surgical history and problem list.  Review of Systems Pertinent items are noted in HPI.    Objective:    General appearance: alert and no distress Breasts: normal appearance, no masses or tenderness Abdomen: normal findings: soft, non-tender Pelvic: cervix normal in appearance, external genitalia normal, no adnexal masses or tenderness, no cervical motion tenderness, uterus normal size, shape, and consistency and vagina normal without discharge    Assessment:    Healthy female exam.   UTI   Plan:    Education reviewed: self breast exams, weight bearing exercise and menorrhagia. Mammogram ordered. Follow up in: 2 weeks. Keflex Rx.

## 2013-01-01 LAB — CBC WITH DIFFERENTIAL/PLATELET
Eosinophils Absolute: 0.1 10*3/uL (ref 0.0–0.7)
Lymphs Abs: 2.6 10*3/uL (ref 0.7–4.0)
MCH: 21.1 pg — ABNORMAL LOW (ref 26.0–34.0)
Neutro Abs: 1.7 10*3/uL (ref 1.7–7.7)
Neutrophils Relative %: 36 % — ABNORMAL LOW (ref 43–77)
Platelets: 419 10*3/uL — ABNORMAL HIGH (ref 150–400)
RBC: 4.64 MIL/uL (ref 3.87–5.11)
WBC: 4.7 10*3/uL (ref 4.0–10.5)

## 2013-01-01 LAB — PAP IG W/ RFLX HPV ASCU

## 2013-01-06 ENCOUNTER — Other Ambulatory Visit: Payer: Self-pay | Admitting: *Deleted

## 2013-01-06 DIAGNOSIS — N760 Acute vaginitis: Secondary | ICD-10-CM

## 2013-01-06 MED ORDER — METRONIDAZOLE 500 MG PO TABS: 500.0000 mg | ORAL_TABLET | Freq: Two times a day (BID) | ORAL | Status: DC

## 2013-01-08 ENCOUNTER — Ambulatory Visit (HOSPITAL_COMMUNITY)
Admission: RE | Admit: 2013-01-08 | Discharge: 2013-01-08 | Disposition: A | Discharge status: Home / Self Care | Payer: Medicaid Other | Source: Ambulatory Visit | Attending: Obstetrics | Admitting: Obstetrics

## 2013-01-08 DIAGNOSIS — Z1231 Encounter for screening mammogram for malignant neoplasm of breast: Secondary | ICD-10-CM | POA: Insufficient documentation

## 2013-01-08 DIAGNOSIS — N920 Excessive and frequent menstruation with regular cycle: Secondary | ICD-10-CM

## 2013-01-08 DIAGNOSIS — Z1239 Encounter for other screening for malignant neoplasm of breast: Secondary | ICD-10-CM

## 2013-01-09 ENCOUNTER — Other Ambulatory Visit: Payer: Self-pay | Admitting: Obstetrics

## 2013-01-12 ENCOUNTER — Other Ambulatory Visit: Payer: Self-pay | Admitting: Obstetrics

## 2013-01-12 DIAGNOSIS — R928 Other abnormal and inconclusive findings on diagnostic imaging of breast: Secondary | ICD-10-CM

## 2013-01-20 ENCOUNTER — Ambulatory Visit: Payer: Medicaid Other | Admitting: Obstetrics

## 2013-01-27 ENCOUNTER — Other Ambulatory Visit: Payer: Medicaid Other

## 2013-03-26 ENCOUNTER — Other Ambulatory Visit: Payer: Self-pay | Admitting: Obstetrics

## 2013-04-14 ENCOUNTER — Other Ambulatory Visit: Payer: Medicaid Other | Admitting: Obstetrics

## 2013-05-04 ENCOUNTER — Other Ambulatory Visit: Payer: Medicaid Other | Admitting: Obstetrics

## 2013-06-03 ENCOUNTER — Ambulatory Visit (INDEPENDENT_AMBULATORY_CARE_PROVIDER_SITE_OTHER): Payer: Medicaid Other | Admitting: Obstetrics

## 2013-06-03 ENCOUNTER — Other Ambulatory Visit: Payer: Self-pay | Admitting: Obstetrics

## 2013-06-03 ENCOUNTER — Encounter: Payer: Self-pay | Admitting: Obstetrics

## 2013-06-03 VITALS — BP 135/85 | HR 78 | Temp 98.7°F | Ht 64.0 in | Wt 295.4 lb

## 2013-06-03 DIAGNOSIS — Z23 Encounter for immunization: Secondary | ICD-10-CM

## 2013-06-03 DIAGNOSIS — N920 Excessive and frequent menstruation with regular cycle: Secondary | ICD-10-CM

## 2013-06-03 LAB — POCT URINE PREGNANCY: Preg Test, Ur: NEGATIVE

## 2013-06-03 MED ORDER — INFLUENZA VAC SPLIT QUAD 0.5 ML IM SUSP: 0.5000 mL | Freq: Once | INTRAMUSCULAR | Status: DC

## 2013-06-03 NOTE — Progress Notes (Signed)
Endometrial Biopsy Procedure Note  Pre-operative Diagnosis: Menorrhagia  Post-operative Diagnosis: same  Indications: abnormal uterine bleeding  Procedure Details   Urine pregnancy test was done in office and result was negative.  The risks (including infection, bleeding, pain, and uterine perforation) and benefits of the procedure were explained to the patient and Written informed consent was obtained.  Antibiotic prophylaxis against endocarditis was not indicated.   The patient was placed in the dorsal lithotomy position.  Bimanual exam showed the uterus to be in the neutral position.  A Graves' speculum inserted in the vagina, and the cervix prepped with povidone iodine.  Endocervical curettage with a Kevorkian curette was not performed.   A sharp tenaculum was applied to the anterior lip of the cervix for stabilization.  A sterile uterine sound was used to sound the uterus to a depth of 7.5cm.  A Pipelle endometrial aspirator was used to sample the endometrium.  Sample was sent for pathologic examination.  Condition: Stable  Complications: None  Plan:  The patient was advised to call for any fever or for prolonged or severe pain or bleeding. She was advised to use NSAID as needed for mild to moderate pain. She was advised to avoid vaginal intercourse for 48 hours or until the bleeding has completely stopped.  Attending Physician Documentation: I was present for or participated in the entire procedure, including opening and closing.

## 2013-06-04 ENCOUNTER — Encounter: Payer: Self-pay | Admitting: Obstetrics

## 2013-06-04 LAB — WET PREP BY MOLECULAR PROBE
Candida species: NEGATIVE
Gardnerella vaginalis: POSITIVE — AB
Trichomonas vaginosis: NEGATIVE

## 2013-06-05 ENCOUNTER — Telehealth: Payer: Self-pay | Admitting: *Deleted

## 2013-06-05 MED ORDER — METRONIDAZOLE 500 MG PO TABS: 500.0000 mg | ORAL_TABLET | Freq: Two times a day (BID) | ORAL | Status: DC

## 2013-06-05 NOTE — Telephone Encounter (Signed)
Message copied by Glendell Docker on Fri Jun 05, 2013  3:01 PM ------      Message from: Coral Ceo A      Created: Fri Jun 05, 2013  6:51 AM       Flagyl 500mg  po bid x 7 days. ------

## 2013-06-05 NOTE — Telephone Encounter (Signed)
Call placed to patient , no answer A detailed voice message was left informing patient of lab results and rx sent to pharmacy on file. A voice message was left advising patient to call office if any questions.

## 2013-06-18 ENCOUNTER — Ambulatory Visit: Payer: Medicaid Other | Admitting: Obstetrics

## 2013-06-30 ENCOUNTER — Other Ambulatory Visit: Payer: Self-pay | Admitting: *Deleted

## 2013-06-30 DIAGNOSIS — N390 Urinary tract infection, site not specified: Secondary | ICD-10-CM

## 2013-06-30 MED ORDER — NITROFURANTOIN MONOHYD MACRO 100 MG PO CAPS: 100.0000 mg | ORAL_CAPSULE | Freq: Two times a day (BID) | ORAL | Status: DC

## 2013-08-13 ENCOUNTER — Ambulatory Visit (INDEPENDENT_AMBULATORY_CARE_PROVIDER_SITE_OTHER): Payer: Medicaid Other | Admitting: Obstetrics

## 2013-08-13 ENCOUNTER — Encounter: Payer: Self-pay | Admitting: Obstetrics

## 2013-08-13 VITALS — Ht 65.0 in | Wt 298.0 lb

## 2013-08-13 DIAGNOSIS — N920 Excessive and frequent menstruation with regular cycle: Secondary | ICD-10-CM

## 2013-08-13 DIAGNOSIS — B373 Candidiasis of vulva and vagina: Secondary | ICD-10-CM | POA: Insufficient documentation

## 2013-08-13 DIAGNOSIS — B3731 Acute candidiasis of vulva and vagina: Secondary | ICD-10-CM

## 2013-08-13 MED ORDER — FLUCONAZOLE 150 MG PO TABS: 150.0000 mg | ORAL_TABLET | Freq: Once | ORAL | Status: DC

## 2013-08-13 MED ORDER — NORETHINDRONE ACETATE 5 MG PO TABS: 10.0000 mg | ORAL_TABLET | Freq: Every day | ORAL | Status: DC

## 2013-08-13 NOTE — Addendum Note (Signed)
Addended by: Henriette CombsHATTON, Shatoria Stooksbury L on: 08/13/2013 04:56 PM   Modules accepted: Orders

## 2013-08-13 NOTE — Progress Notes (Signed)
Subjective:     Allison Dennis is a 38 y.o. female here for a routine exam.  Current complaints: follow up on biopsy test results. Patient states she is having discharge with itching. Pt denies any odor or burning.  Personal health questionnaire reviewed: yes.   Gynecologic History Patient's last menstrual period was 07/29/2013. Contraception: tubal ligation Last Pap: 12/31/12. Results were: normal Last mammogram: 01/2013. Results were: left breast mass.   Obstetric History OB History  Gravida Para Term Preterm AB SAB TAB Ectopic Multiple Living  5 4 3 1 1 1  0 0 1 5    # Outcome Date GA Lbr Len/2nd Weight Sex Delivery Anes PTL Lv  5 TRM 02/02/10 [id number]    LTCS Spinal  Y  4 TRM 01/23/07 [id number]    SVD   Y  3 SAB 2007          2 TRM 03/28/02 [id number]    SVD   Y  1A PRE 03/30/00 [id number]    SVD EPI  Y  1B  03/30/00 [id number]     EPI  Y    Obstetric Comments  Pt had twins with this pregnancy.     The following portions of the patient's history were reviewed and updated as appropriate: allergies, current medications, past family history, past medical history, past social history, past surgical history and problem list.  Review of Systems Pertinent items are noted in HPI.    Objective:    General appearance: alert and no distress Abdomen: normal findings: soft, non-tender Pelvic: cervix normal in appearance, external genitalia normal, no adnexal masses or tenderness, no cervical motion tenderness, rectovaginal septum normal, uterus normal size, shape, and consistency and vagina normal without discharge    Assessment:    AUB  Candida Vaginitis.   Plan:    Education reviewed: safe sex/STD prevention and management of candida vaginitis.  Endometrial Ablation.. Contraception: tubal ligation. Follow up in: 1 month. Aygestin and Diflucan Rx.   Plan to do Ablation after 1 month of Aygestin.

## 2013-08-14 LAB — WET PREP BY MOLECULAR PROBE
Candida species: NEGATIVE
GARDNERELLA VAGINALIS: POSITIVE — AB
Trichomonas vaginosis: NEGATIVE

## 2013-09-01 ENCOUNTER — Other Ambulatory Visit: Payer: Medicaid Other

## 2013-09-01 ENCOUNTER — Inpatient Hospital Stay: Admission: RE | Admit: 2013-09-01 | Payer: Medicaid Other | Source: Ambulatory Visit

## 2013-09-03 ENCOUNTER — Ambulatory Visit: Payer: Medicaid Other | Admitting: Obstetrics

## 2013-09-08 ENCOUNTER — Other Ambulatory Visit: Payer: Self-pay | Admitting: *Deleted

## 2013-09-08 DIAGNOSIS — B9689 Other specified bacterial agents as the cause of diseases classified elsewhere: Secondary | ICD-10-CM

## 2013-09-08 DIAGNOSIS — N760 Acute vaginitis: Principal | ICD-10-CM

## 2013-09-08 MED ORDER — METRONIDAZOLE 500 MG PO TABS: 500.0000 mg | ORAL_TABLET | Freq: Two times a day (BID) | ORAL | Status: DC

## 2013-11-05 ENCOUNTER — Ambulatory Visit: Payer: Medicaid Other | Admitting: Obstetrics

## 2013-11-12 ENCOUNTER — Ambulatory Visit: Payer: Medicaid Other | Admitting: Obstetrics

## 2013-11-19 IMAGING — US US PELVIS COMPLETE
1 series · 14 of 25 positions shown · non-contrast
Comparison: None

CLINICAL DATA: Chronic menorrhagia.  Morbid obesity.  LMP
12/28/2012.  A transabdominal approach only was performed per
physician request

TRANSABDOMINAL ULTRASOUND OF PELVIS
TECHNIQUE: Transabdominal ultrasound examination of the pelvis was
performed including evaluation of the uterus, ovaries, adnexal
regions, and pelvic cul-de-sac.

[Series 1: us pelvis complete · 28 acquisitions, 14 frames shown]
[im 1/28]
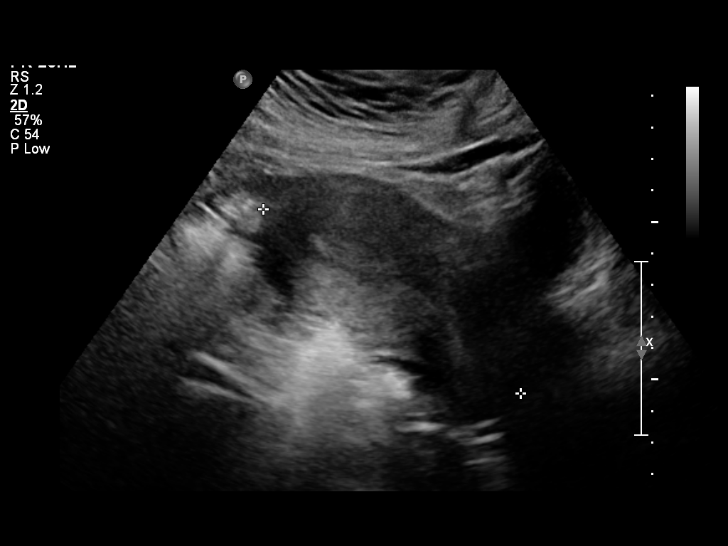
[im 3/28]
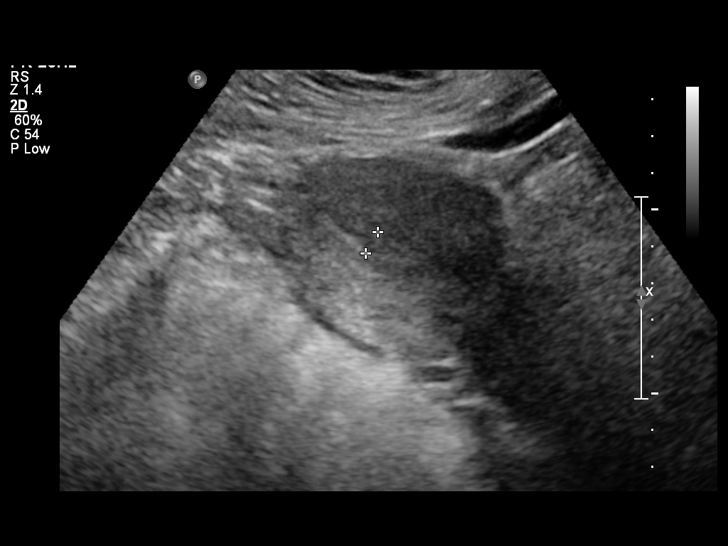
[im 5/28]
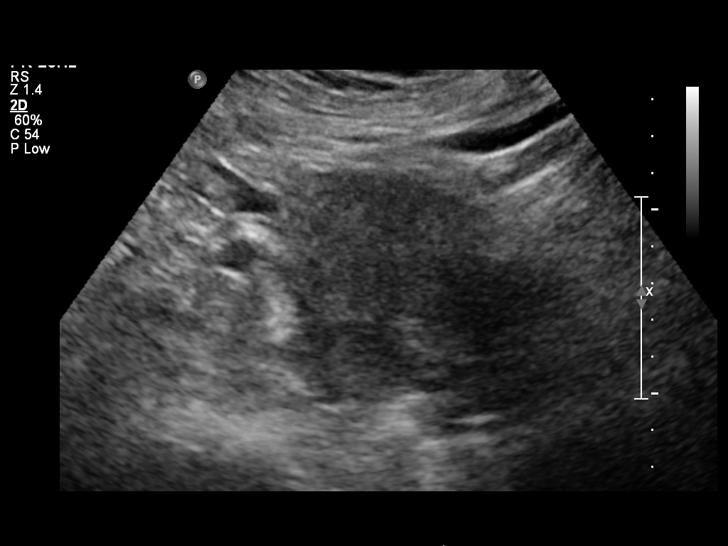
[im 7/28]
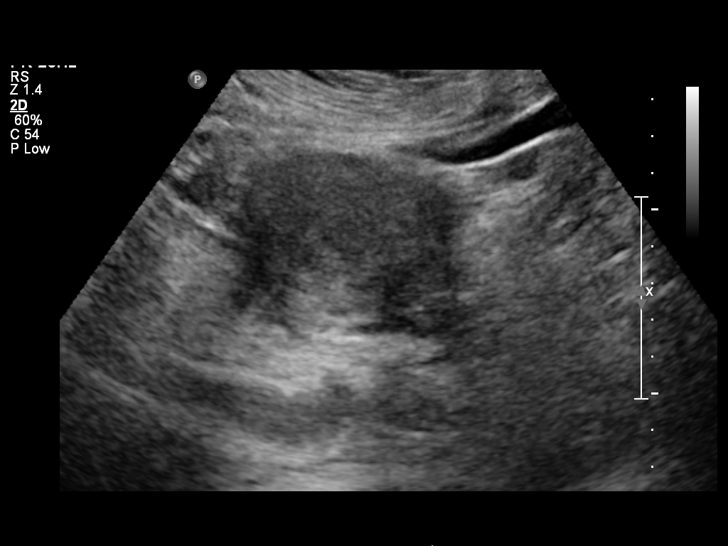
[im 10/28]
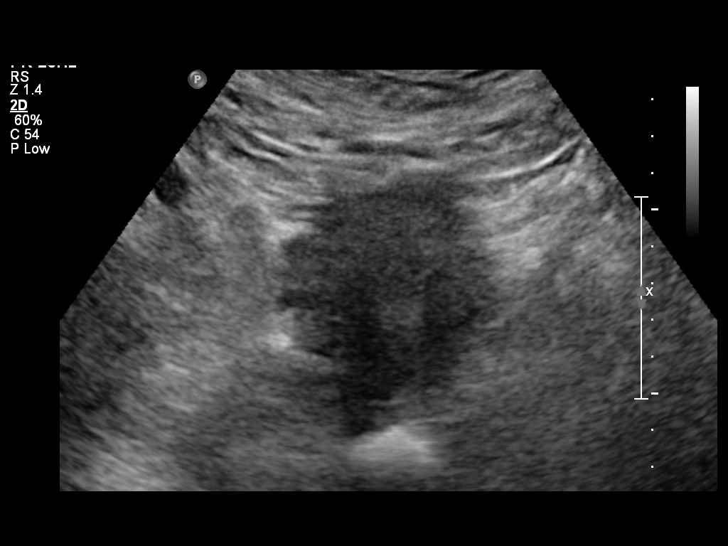
[im 11/28]
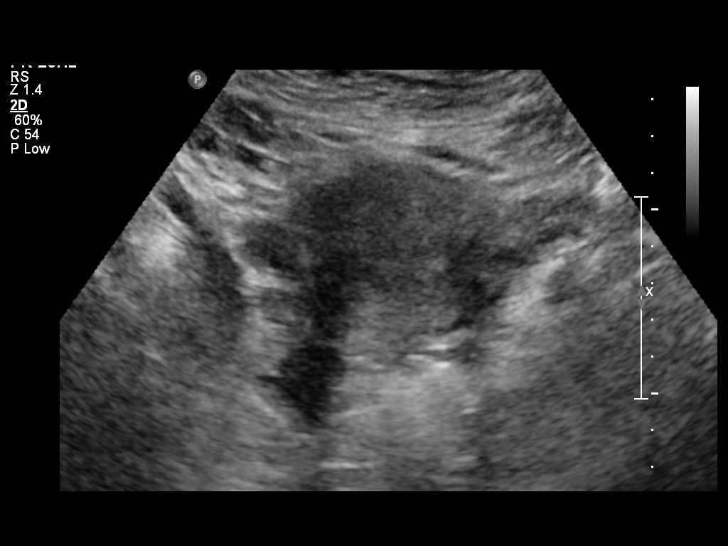
[im 13/28]
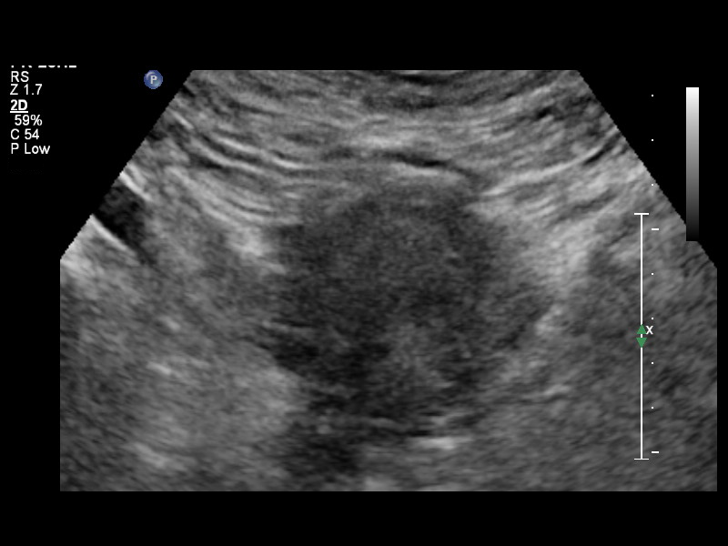
[im 15/28]
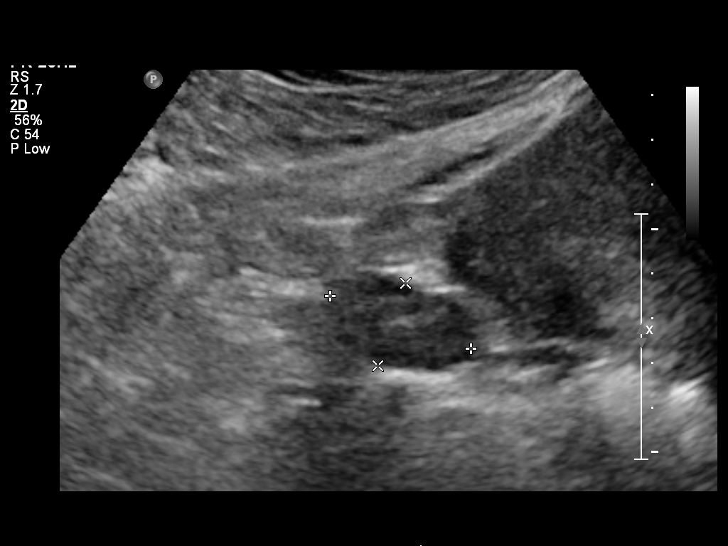
[im 17/28]
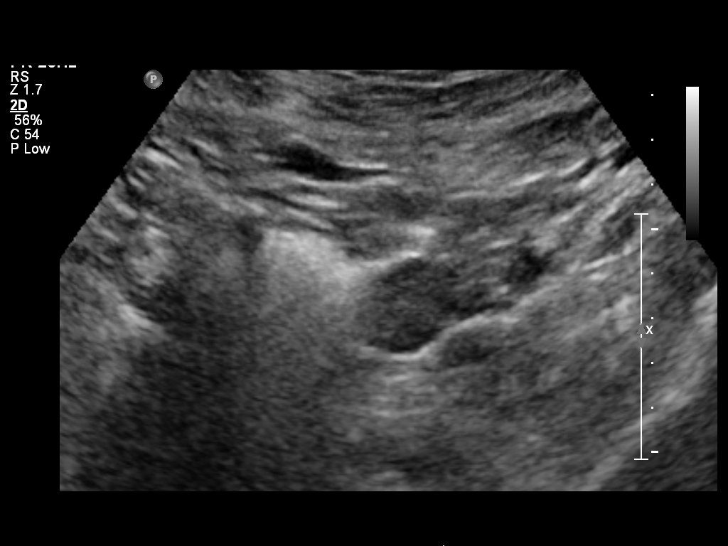
[im 19/28]
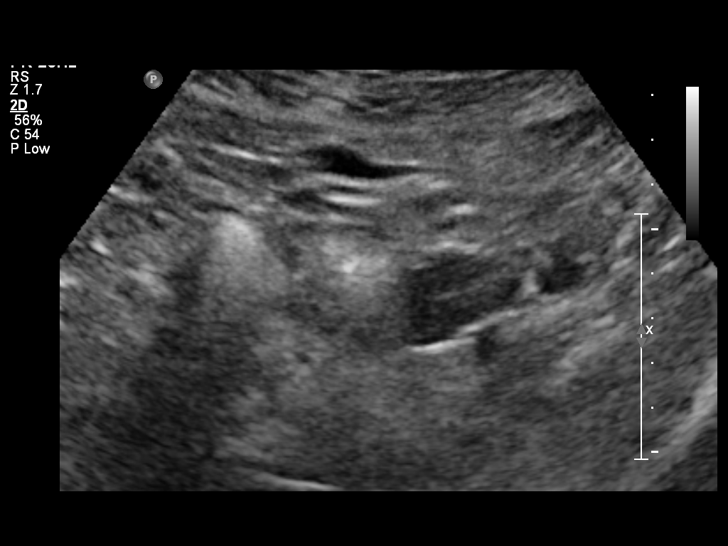
[im 21/28]
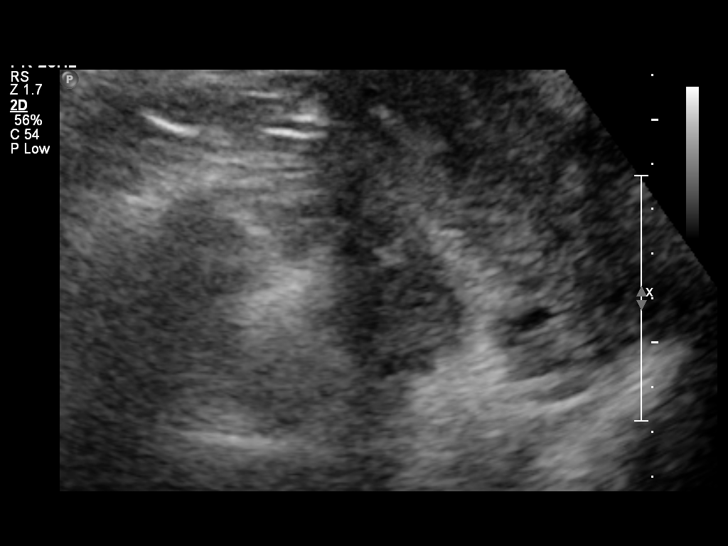
[im 23/28]
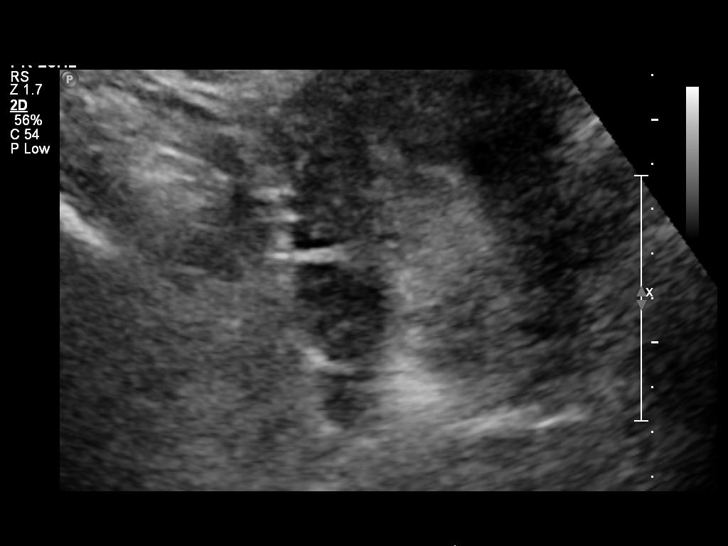
[im 25/28]
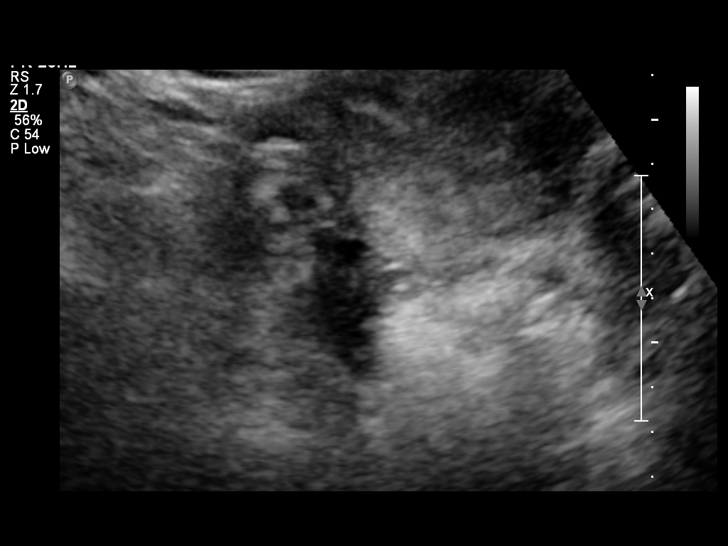
[im 28/28]
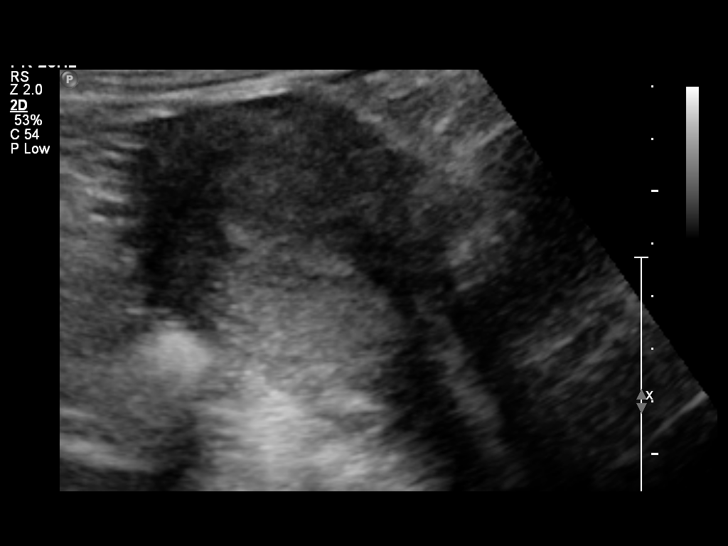

[14 of 25 positions shown; findings below may reference images not displayed]

FINDINGS: Uterus:  Appears anteverted and anteflexed and demonstrates a
sagittal length of 10 cm, depth of 5 cm and width of 5.4 cm.  No
definite mural abnormalities are seen

Endometrium: Is difficult to accurately measure due to the
transabdominal only approach.  Width is estimated at 8 mm.  No
discrete focal abnormalities are seen but overall resolution of the
endometrium is diminished by the transabdominal only approach.

Right ovary: Has a normal transabdominal appearance measuring 3.2 x
2.4 x 2.1 cm

Left ovary: Has a normal appearance measuring 3.4 x 1.9 x 2.3 cm

Other Findings:  No pelvic fluid is seen
IMPRESSION: Unremarkable transabdominal pelvic ultrasound

## 2013-11-26 ENCOUNTER — Ambulatory Visit: Payer: Medicaid Other | Admitting: Obstetrics

## 2013-12-14 ENCOUNTER — Other Ambulatory Visit: Payer: Self-pay | Admitting: Obstetrics

## 2013-12-14 DIAGNOSIS — N920 Excessive and frequent menstruation with regular cycle: Secondary | ICD-10-CM

## 2013-12-14 MED ORDER — MEDROXYPROGESTERONE ACETATE 10 MG PO TABS: 20.0000 mg | ORAL_TABLET | Freq: Every day | ORAL | Status: DC

## 2013-12-15 ENCOUNTER — Encounter (HOSPITAL_COMMUNITY)
Admission: RE | Admit: 2013-12-15 | Discharge: 2013-12-15 | Disposition: A | Discharge status: Home / Self Care | Payer: Medicaid Other | Source: Ambulatory Visit | Attending: Obstetrics | Admitting: Obstetrics

## 2013-12-15 ENCOUNTER — Encounter (HOSPITAL_COMMUNITY): Payer: Self-pay

## 2013-12-15 ENCOUNTER — Encounter (HOSPITAL_COMMUNITY): Payer: Self-pay | Admitting: Pharmacist

## 2013-12-15 ENCOUNTER — Other Ambulatory Visit: Payer: Self-pay | Admitting: *Deleted

## 2013-12-15 HISTORY — DX: Anemia, unspecified: D64.9

## 2013-12-15 LAB — CBC
HEMATOCRIT: 33.2 % — AB (ref 36.0–46.0)
Hemoglobin: 10 g/dL — ABNORMAL LOW (ref 12.0–15.0)
MCH: 20.7 pg — ABNORMAL LOW (ref 26.0–34.0)
MCHC: 30.1 g/dL (ref 30.0–36.0)
MCV: 68.7 fL — ABNORMAL LOW (ref 78.0–100.0)
Platelets: 374 10*3/uL (ref 150–400)
RBC: 4.83 MIL/uL (ref 3.87–5.11)
RDW: 19.5 % — AB (ref 11.5–15.5)
WBC: 6.5 10*3/uL (ref 4.0–10.5)

## 2013-12-15 LAB — BASIC METABOLIC PANEL
BUN: 9 mg/dL (ref 6–23)
CHLORIDE: 108 meq/L (ref 96–112)
CO2: 27 meq/L (ref 19–32)
Calcium: 9.6 mg/dL (ref 8.4–10.5)
Creatinine, Ser: 0.68 mg/dL (ref 0.50–1.10)
GFR calc Af Amer: >90 mL/min (ref >90)
GFR calc non Af Amer: >90 mL/min (ref >90)
Glucose, Bld: 92 mg/dL (ref 70–99)
POTASSIUM: 5.3 meq/L (ref 3.7–5.3)
SODIUM: 144 meq/L (ref 137–147)

## 2013-12-15 NOTE — Patient Instructions (Signed)
   Your procedure is scheduled on:  Friday, May 15  Enter through the Main Entrance of [facility] at: 730 AM Pick up the phone at the desk and dial [phone] and inform us of your arrival.  Please call this number if you have any problems the morning of surgery: [phone]  Remember: Do not eat or drink after midnight: Thursday Take these medicines the morning of surgery with a SIP OF WATER:  lisinopril  Do not wear jewelry, make-up, or FINGER nail polish No metal in your hair or on your body. Do not wear lotions, powders, perfumes.  You may wear deodorant.  Do not bring valuables to the hospital. Contacts, dentures or bridgework may not be worn into surgery.  Patients discharged on the day of surgery will not be allowed to drive home.     Home with boyfriend Allison Dennis.

## 2013-12-15 NOTE — Pre-Procedure Instructions (Signed)
   Your procedure is scheduled on:  Friday, May 15  Enter through the [facility] at: 730 AM Pick up the phone at the desk and dial [phone] and inform us of your arrival.  Please call this number if you have any problems the morning of surgery: [phone]  Remember: Do not eat or drink after midnight: Thursday Take these medicines the morning of surgery with a SIP OF WATER:  lisinopril  Do not wear jewelry, make-up, or FINGER nail polish No metal in your hair or on your body. Do not wear lotions, powders, perfumes.  You may wear deodorant.  Do not bring valuables to the hospital. Contacts, dentures or bridgework may not be worn into surgery.  Patients discharged on the day of surgery will not be allowed to drive home.

## 2013-12-16 ENCOUNTER — Encounter: Payer: Self-pay | Admitting: Obstetrics

## 2013-12-18 ENCOUNTER — Other Ambulatory Visit: Payer: Self-pay | Admitting: Obstetrics

## 2013-12-18 ENCOUNTER — Encounter (HOSPITAL_COMMUNITY): Payer: Medicaid Other | Admitting: Anesthesiology

## 2013-12-18 ENCOUNTER — Ambulatory Visit (HOSPITAL_COMMUNITY): Payer: Medicaid Other | Admitting: Anesthesiology

## 2013-12-18 ENCOUNTER — Ambulatory Visit (HOSPITAL_COMMUNITY)
Admission: RE | Admit: 2013-12-18 | Discharge: 2013-12-18 | Disposition: A | Discharge status: Home / Self Care | Payer: Medicaid Other | Source: Ambulatory Visit | Attending: Obstetrics | Admitting: Obstetrics

## 2013-12-18 ENCOUNTER — Encounter (HOSPITAL_COMMUNITY): Payer: Self-pay | Admitting: *Deleted

## 2013-12-18 ENCOUNTER — Encounter (HOSPITAL_COMMUNITY)
Admission: RE | Disposition: A | Discharge status: Home / Self Care | Payer: Self-pay | Source: Ambulatory Visit | Attending: Obstetrics

## 2013-12-18 DIAGNOSIS — N920 Excessive and frequent menstruation with regular cycle: Secondary | ICD-10-CM | POA: Insufficient documentation

## 2013-12-18 DIAGNOSIS — Z79899 Other long term (current) drug therapy: Secondary | ICD-10-CM | POA: Insufficient documentation

## 2013-12-18 DIAGNOSIS — I10 Essential (primary) hypertension: Secondary | ICD-10-CM | POA: Insufficient documentation

## 2013-12-18 DIAGNOSIS — R52 Pain, unspecified: Secondary | ICD-10-CM

## 2013-12-18 DIAGNOSIS — D649 Anemia, unspecified: Secondary | ICD-10-CM | POA: Insufficient documentation

## 2013-12-18 DIAGNOSIS — N946 Dysmenorrhea, unspecified: Secondary | ICD-10-CM | POA: Insufficient documentation

## 2013-12-18 HISTORY — PX: DILITATION & CURRETTAGE/HYSTROSCOPY WITH HYDROTHERMAL ABLATION: SHX5570

## 2013-12-18 LAB — PREGNANCY, URINE: PREG TEST UR: NEGATIVE

## 2013-12-18 SURGERY — DILATATION & CURETTAGE/HYSTEROSCOPY WITH HYDROTHERMAL ABLATION
Anesthesia: General | Site: Vagina

## 2013-12-18 MED ORDER — HYDROCODONE-ACETAMINOPHEN 5-325 MG PO TABS
1.0000 | ORAL_TABLET | Freq: Once | ORAL | Status: AC
  Administered 2013-12-18: 1 via ORAL

## 2013-12-18 MED ORDER — HYDROCODONE-ACETAMINOPHEN 5-325 MG PO TABS
ORAL_TABLET | ORAL | Status: AC
  Administered 2013-12-18: 1 via ORAL
  Filled 2013-12-18: qty 1

## 2013-12-18 MED ORDER — ONDANSETRON HCL 4 MG/2ML IJ SOLN
INTRAMUSCULAR | Status: DC | PRN
  Administered 2013-12-18: 4 mg via INTRAVENOUS

## 2013-12-18 MED ORDER — FENTANYL CITRATE 0.05 MG/ML IJ SOLN
INTRAMUSCULAR | Status: AC
  Administered 2013-12-18: 50 ug
  Filled 2013-12-18: qty 2

## 2013-12-18 MED ORDER — KETOROLAC TROMETHAMINE 30 MG/ML IJ SOLN
INTRAMUSCULAR | Status: AC
  Filled 2013-12-18: qty 1

## 2013-12-18 MED ORDER — SODIUM CHLORIDE 0.9 % IR SOLN
Status: DC | PRN
  Administered 2013-12-18: 3000 mL

## 2013-12-18 MED ORDER — LIDOCAINE HCL 1 % IJ SOLN
INTRAMUSCULAR | Status: DC | PRN
  Administered 2013-12-18: 20 mL

## 2013-12-18 MED ORDER — HYDROCODONE-ACETAMINOPHEN 7.5-325 MG PO TABS: 1.0000 | ORAL_TABLET | Freq: Four times a day (QID) | ORAL | Status: DC | PRN

## 2013-12-18 MED ORDER — IBUPROFEN 800 MG PO TABS: 800.0000 mg | ORAL_TABLET | Freq: Three times a day (TID) | ORAL | Status: DC | PRN

## 2013-12-18 MED ORDER — LIDOCAINE HCL 1 % IJ SOLN
INTRAMUSCULAR | Status: AC
  Filled 2013-12-18: qty 20

## 2013-12-18 MED ORDER — PROPOFOL 10 MG/ML IV EMUL
INTRAVENOUS | Status: AC
  Filled 2013-12-18: qty 20

## 2013-12-18 MED ORDER — MIDAZOLAM HCL 5 MG/5ML IJ SOLN
INTRAMUSCULAR | Status: DC | PRN
  Administered 2013-12-18: 2 mg via INTRAVENOUS

## 2013-12-18 MED ORDER — MIDAZOLAM HCL 2 MG/2ML IJ SOLN
INTRAMUSCULAR | Status: AC
  Filled 2013-12-18: qty 2

## 2013-12-18 MED ORDER — KETOROLAC TROMETHAMINE 30 MG/ML IJ SOLN
INTRAMUSCULAR | Status: DC | PRN
  Administered 2013-12-18: 30 mg via INTRAVENOUS

## 2013-12-18 MED ORDER — DEXAMETHASONE SODIUM PHOSPHATE 10 MG/ML IJ SOLN
INTRAMUSCULAR | Status: DC | PRN
  Administered 2013-12-18: 10 mg via INTRAVENOUS

## 2013-12-18 MED ORDER — LACTATED RINGERS IV SOLN
INTRAVENOUS | Status: DC
  Administered 2013-12-18: 10 mL/h via INTRAVENOUS
  Administered 2013-12-18: 08:00:00 via INTRAVENOUS

## 2013-12-18 MED ORDER — FENTANYL CITRATE 0.05 MG/ML IJ SOLN
INTRAMUSCULAR | Status: AC
  Filled 2013-12-18: qty 2

## 2013-12-18 MED ORDER — PROPOFOL 10 MG/ML IV BOLUS
INTRAVENOUS | Status: DC | PRN
  Administered 2013-12-18: 200 mg via INTRAVENOUS

## 2013-12-18 MED ORDER — LIDOCAINE HCL (CARDIAC) 20 MG/ML IV SOLN
INTRAVENOUS | Status: DC | PRN
  Administered 2013-12-18: 50 mg via INTRAVENOUS

## 2013-12-18 MED ORDER — ONDANSETRON HCL 4 MG/2ML IJ SOLN
INTRAMUSCULAR | Status: AC
  Filled 2013-12-18: qty 2

## 2013-12-18 MED ORDER — DEXAMETHASONE SODIUM PHOSPHATE 10 MG/ML IJ SOLN
INTRAMUSCULAR | Status: AC
  Filled 2013-12-18: qty 1

## 2013-12-18 MED ORDER — FENTANYL CITRATE 0.05 MG/ML IJ SOLN
INTRAMUSCULAR | Status: DC | PRN
  Administered 2013-12-18: 50 ug via INTRAVENOUS
  Administered 2013-12-18 (×2): 25 ug via INTRAVENOUS

## 2013-12-18 MED ORDER — LIDOCAINE HCL (CARDIAC) 20 MG/ML IV SOLN
INTRAVENOUS | Status: AC
  Filled 2013-12-18: qty 5

## 2013-12-18 MED ORDER — FENTANYL CITRATE 0.05 MG/ML IJ SOLN
100.0000 ug | Freq: Once | INTRAMUSCULAR | Status: AC
  Administered 2013-12-18: 50 ug via INTRAVENOUS

## 2013-12-18 SURGICAL SUPPLY — 17 items
CATH ROBINSON RED A/P 16FR (CATHETERS) ×3 IMPLANT
CLOTH BEACON ORANGE TIMEOUT ST (SAFETY) ×3 IMPLANT
CONTAINER PREFILL 10% NBF 60ML (FORM) ×6 IMPLANT
DRAPE HYSTEROSCOPY (DRAPE) ×3 IMPLANT
DRSG TELFA 3X8 NADH (GAUZE/BANDAGES/DRESSINGS) ×3 IMPLANT
ELECT REM PT RETURN 9FT ADLT (ELECTROSURGICAL)
ELECTRODE REM PT RTRN 9FT ADLT (ELECTROSURGICAL) IMPLANT
GLOVE BIO SURGEON STRL SZ8 (GLOVE) ×6 IMPLANT
GOWN STRL REUS W/TWL LRG LVL3 (GOWN DISPOSABLE) ×6 IMPLANT
NEEDLE SPNL 22GX3.5 QUINCKE BK (NEEDLE) ×6 IMPLANT
NS IRRIG 1000ML POUR BTL (IV SOLUTION) ×3 IMPLANT
PACK VAGINAL MINOR WOMEN LF (CUSTOM PROCEDURE TRAY) ×3 IMPLANT
PAD OB MATERNITY 4.3X12.25 (PERSONAL CARE ITEMS) ×3 IMPLANT
SET GENESYS HTA PROCERVA (MISCELLANEOUS) ×3 IMPLANT
SYR CONTROL 10ML LL (SYRINGE) ×6 IMPLANT
TOWEL OR 17X24 6PK STRL BLUE (TOWEL DISPOSABLE) ×6 IMPLANT
WATER STERILE IRR 1000ML POUR (IV SOLUTION) ×3 IMPLANT

## 2013-12-18 NOTE — Anesthesia Preprocedure Evaluation (Addendum)
Anesthesia Evaluation  Patient identified by MRN, date of birth, ID band Patient awake    Reviewed: Allergy & Precautions, H&P , NPO status , Patient's Chart, lab work & pertinent test results  Airway Mallampati: II TM Distance: >3 FB Neck ROM: Full    Dental  (+) Dental Advisory Given   Pulmonary neg pulmonary ROS,  breath sounds clear to auscultation        Cardiovascular hypertension, Pt. on medications negative cardio ROS  Rhythm:Regular Rate:Normal     Neuro/Psych negative neurological ROS  negative psych ROS   GI/Hepatic negative GI ROS, Neg liver ROS,   Endo/Other  Morbid obesity  Renal/GU negative Renal ROS     Musculoskeletal negative musculoskeletal ROS (+)   Abdominal (+) + obese,   Peds  Hematology negative hematology ROS (+) anemia ,   Anesthesia Other Findings   Reproductive/Obstetrics negative OB ROS                          Anesthesia Physical Anesthesia Plan  ASA: III  Anesthesia Plan: General   Post-op Pain Management:    Induction: Intravenous  Airway Management Planned: LMA  Additional Equipment:   Intra-op Plan:   Post-operative Plan: Extubation in OR  Informed Consent: I have reviewed the patients History and Physical, chart, labs and discussed the procedure including the risks, benefits and alternatives for the proposed anesthesia with the patient or authorized representative who has indicated his/her understanding and acceptance.   Dental advisory given  Plan Discussed with: CRNA  Anesthesia Plan Comments:        Anesthesia Quick Evaluation

## 2013-12-18 NOTE — Op Note (Signed)
Preop Diagnosis: MENORRHAGIA   Postop Diagnosis: menorrhagia   Procedure: DILATATION & CURETTAGE/HYSTEROSCOPY WITH HYDROTHERMAL ABLATION   Anesthesia: General   Anesthesiologist: Dana AllanAmy Cassidy, MD   Attending: Brock Badharles A Markanthony Gedney, MD   Assistant:  Surgical Technician  Findings:  Normal endometrial cavity  Pathology:  Endometrial curettings  Fluids:  1000ml  UOP:  30ml ( clear )  EBL:  5ml  Complications:  None  Procedure: The patient was taken to the operating room after risks benefits and alternatives were discussed with patient, the patient verbalized understanding and consent signed and witnessed. The patient was placed under general anesthesia and prepped and draped in normal sterile fashion. A bivalve speculum was placed in the patient's vagina and the anterior lip of the cervix was grasped with a single-tooth tenaculum. The cervix was dilated for passage of the hysteroscope. The uterus sounded to 10cm.  The hysteroscope was introduced into the uterine cavity with findings as noted above. A curettage was performed and currettings sent to pathology. The hysteroscope was reintroduced and hydrothermal ablation was performed without difficulty. The tenaculum and bivalve speculum were removed and there was good hemostasis at the tenaculum sites. Sponge lap and needle count was correct. The patient tolerated procedure well and was returned to the recovery room in good condition.

## 2013-12-18 NOTE — Anesthesia Postprocedure Evaluation (Signed)
  Anesthesia Post-op Note  Anesthesia Post Note  Patient: Allison Dennis  Procedure(s) Performed: Procedure(s) (LRB): DILATATION & CURETTAGE/HYSTEROSCOPY WITH HYDROTHERMAL ABLATION (N/A)  Anesthesia type: General  Patient location: PACU  Post pain: Pain level controlled  Post assessment: Post-op Vital signs reviewed  Last Vitals:  Filed Vitals:   12/18/13 0945  BP:   Pulse: 79  Temp:   Resp:     Post vital signs: Reviewed  Level of consciousness: sedated  Complications: No apparent anesthesia complications

## 2013-12-18 NOTE — H&P (Signed)
Allison Dennis is an 38 y.o. female.   H/O heavy and painful periods that were unresponsive to medical therapy.  Pertinent Gynecological History: Menses: flow is moderate Bleeding: dysfunctional uterine bleeding Contraception: tubal ligation DES exposure: denies Blood transfusions: none Sexually transmitted diseases: no past history Previous GYN Procedures: C/S and tubal ligation  Last mammogram: n/a Date: n/a Last pap: normal Date: 2014 OB History: G5, P3115    No LMP recorded.    Past Medical History  Diagnosis Date  . Hypertension   . Anemia     history  . SVD (spontaneous vaginal delivery)     x 3 - 1 set of twins    Past Surgical History  Procedure Laterality Date  . Cesarean section      x 1  . Tonsillectomy    . Tubal ligation Bilateral   . Wisdom tooth extraction      History reviewed. No pertinent family history.  Social History:  reports that she has never smoked. She has never used smokeless tobacco. She reports that she drinks alcohol. She reports that she does not use illicit drugs.  Allergies: No Known Allergies  Prescriptions prior to admission  Medication Sig Dispense Refill  . lisinopril (PRINIVIL,ZESTRIL) 20 MG tablet TAKE 1 TABLET BY MOUTH EVERY DAY FOR BLOOD PRESSURE  30 tablet  3  . medroxyPROGESTERone (PROVERA) 10 MG tablet Take 2 tablets (20 mg total) by mouth daily.  10 tablet  0    Review of Systems  All other systems reviewed and are negative.   Blood pressure 152/84, pulse 86, temperature 98.6 F (37 C), temperature source Oral, resp. rate 22, SpO2 100.00%. Physical Exam  Results for orders placed during the hospital encounter of 12/18/13 (from the past 24 hour(s))  PREGNANCY, URINE     Status: None   Collection Time    12/18/13  6:00 AM      Result Value Ref Range   Preg Test, Ur NEGATIVE  NEGATIVE    No results found.  Assessment/Plan: AUB with heavy and painful periods, unresponsive to medical therapy.  Plan  Hysteroscopy, D&C and HTA Endometrial Ablation.  Brock Badharles A Derrian Poli 12/18/2013, 7:21 AM

## 2013-12-18 NOTE — Discharge Instructions (Signed)

## 2013-12-18 NOTE — Transfer of Care (Signed)
Immediate Anesthesia Transfer of Care Note  Patient: Allison Dennis  Procedure(s) Performed: Procedure(s): DILATATION & CURETTAGE/HYSTEROSCOPY WITH HYDROTHERMAL ABLATION (N/A)  Patient Location: PACU  Anesthesia Type:General  Level of Consciousness: awake, alert  and oriented  Airway & Oxygen Therapy: Patient Spontanous Breathing and Patient connected to nasal cannula oxygen  Post-op Assessment: Report given to PACU RN  Post vital signs: Reviewed  Complications: No apparent anesthesia complications

## 2013-12-21 ENCOUNTER — Encounter (HOSPITAL_COMMUNITY): Payer: Self-pay | Admitting: Obstetrics

## 2013-12-24 ENCOUNTER — Telehealth: Payer: Self-pay | Admitting: *Deleted

## 2013-12-24 NOTE — Telephone Encounter (Signed)
Patient is requesting a note for work for Friday when she had her ablation. 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 Patient states she is having chills and wants to know if that is normal. Patient states she is at work and will get off at 4:00. She states she is coming by the office at that time.

## 2013-12-29 ENCOUNTER — Telehealth: Payer: Self-pay | Admitting: *Deleted

## 2013-12-29 NOTE — Telephone Encounter (Signed)
Pharmacy requesting RF of Lisinopril

## 2014-01-11 NOTE — Telephone Encounter (Signed)
Patient did not come to office- has appointment end of month.

## 2014-01-25 ENCOUNTER — Ambulatory Visit (INDEPENDENT_AMBULATORY_CARE_PROVIDER_SITE_OTHER): Payer: Medicaid Other | Admitting: Obstetrics

## 2014-01-25 ENCOUNTER — Encounter: Payer: Self-pay | Admitting: Obstetrics

## 2014-01-25 ENCOUNTER — Encounter: Payer: Medicaid Other | Admitting: Obstetrics

## 2014-01-25 VITALS — BP 128/84 | HR 89 | Temp 98.3°F | Ht 64.0 in | Wt 299.0 lb

## 2014-01-25 DIAGNOSIS — L293 Anogenital pruritus, unspecified: Secondary | ICD-10-CM

## 2014-01-25 DIAGNOSIS — N926 Irregular menstruation, unspecified: Secondary | ICD-10-CM

## 2014-01-25 DIAGNOSIS — B356 Tinea cruris: Secondary | ICD-10-CM

## 2014-01-25 DIAGNOSIS — A499 Bacterial infection, unspecified: Secondary | ICD-10-CM

## 2014-01-25 DIAGNOSIS — B9689 Other specified bacterial agents as the cause of diseases classified elsewhere: Secondary | ICD-10-CM

## 2014-01-25 DIAGNOSIS — N939 Abnormal uterine and vaginal bleeding, unspecified: Principal | ICD-10-CM

## 2014-01-25 DIAGNOSIS — N898 Other specified noninflammatory disorders of vagina: Secondary | ICD-10-CM

## 2014-01-25 DIAGNOSIS — N719 Inflammatory disease of uterus, unspecified: Secondary | ICD-10-CM

## 2014-01-25 DIAGNOSIS — N760 Acute vaginitis: Secondary | ICD-10-CM

## 2014-01-25 MED ORDER — METRONIDAZOLE 500 MG PO TABS: ORAL_TABLET | ORAL | Status: DC

## 2014-01-25 MED ORDER — DOXYCYCLINE HYCLATE 100 MG PO CAPS: 100.0000 mg | ORAL_CAPSULE | Freq: Two times a day (BID) | ORAL | Status: DC

## 2014-01-25 MED ORDER — CLOTRIMAZOLE 1 % EX CREA: 1.0000 "application " | TOPICAL_CREAM | Freq: Two times a day (BID) | CUTANEOUS | Status: DC

## 2014-01-25 MED ORDER — METRONIDAZOLE 500 MG PO TABS: 500.0000 mg | ORAL_TABLET | Freq: Two times a day (BID) | ORAL | Status: DC

## 2014-01-25 NOTE — Patient Instructions (Signed)
Subjective:     Allison Dennis is a 38 y.o. female who presents to the clinic 6 weeks status post diagnostic hysteroscopy and HTA Endometrial Ablation for abnormal uterine bleeding. Eating a regular diet without difficulty. Bowel movements are normal. slight uterine pain observed.   The following portions of the patient's history were reviewed and updated as appropriate: allergies, current medications, past family history, past medical history, past social history, past surgical history and problem list.  Review of Systems A comprehensive review of systems was negative except for: Genitourinary: positive for vaginal discharge and uterine tenderness@SUBJECTIVEEND @   Objective:    BP 128/84  Pulse 89  Temp(Src) 98.3 F (36.8 C)  Ht 5\' 4"  (1.626 m)  Wt 299 lb (135.626 kg)  BMI 51.30 kg/m2  LMP 12/14/2013 General:  alert and no distress  Abdomen: soft, bowel sounds active, non-tender  Incision:     None Pelvic:  NEFG.  Vagina and cervix normal with scant red tinged discharge, malodorous.  Uterus NSSC, mildly tender.  Adnexa negative.       Assessment:    Postoperative course complicated by BV and mild endometritis. Operative findings again reviewed. Pathology report discussed.   Plan:    1. Continue any current medications. 2. Wound care discussed. 3. Activity restrictions: none 4. Anticipated return to work: now. 5. Follow up: 3 months for annual exam and F/U.

## 2014-01-26 LAB — WET PREP BY MOLECULAR PROBE
Candida species: NEGATIVE
Gardnerella vaginalis: NEGATIVE
TRICHOMONAS VAG: NEGATIVE

## 2014-01-26 NOTE — Progress Notes (Signed)
Subjective:     Allison Dennis is a 38 y.o. female who presents to the clinic 6 weeks status post diagnostic hysteroscopy and HTA for vaginal discharge and irritation and odor.. Eating a regular diet without difficulty. Bowel movements are normal. Having lower mid pelvic pain.  The following portions of the patient's history were reviewed and updated as appropriate: allergies, current medications, past family history, past medical history, past social history, past surgical history and problem list.  Review of Systems A comprehensive review of systems was negative except for: Genitourinary: positive for vaginal discharge and pelvic pain    Objective:    BP 128/84  Pulse 89  Temp(Src) 98.3 F (36.8 C)  Ht 5\' 4"  (1.626 m)  Wt 299 lb (135.626 kg)  BMI 51.30 kg/m2  LMP 12/14/2013 General:  alert and no distress  Abdomen:   Soft, nontender  Incision:     None PE:  NEFG.  Vagina and cervix normal.  Uterus tender.  Adnexa negative.       Assessment:    BV.  Post op mild endometritis. Operative findings again reviewed. Pathology report discussed.  Jock itch.   Plan:    1. Continue any current medications. 2. Wound care discussed. 3. Activity restrictions: none 4. Anticipated return to work: now. 5. Follow up: 3 months for annual and F/U of BV. 6.  Doxy / Flagyl Rx 7.  Clotrimazole cream Rx.

## 2014-02-10 ENCOUNTER — Telehealth: Payer: Self-pay | Admitting: *Deleted

## 2014-02-10 NOTE — Telephone Encounter (Signed)
Patient is requesting a refill on her Lisinopril

## 2014-02-15 NOTE — Telephone Encounter (Signed)
Patient should call PMD.

## 2014-02-16 NOTE — Telephone Encounter (Signed)
Left message on patient VM- needs to follow up with primary care- please call back if needs referral

## 2014-03-18 ENCOUNTER — Encounter (HOSPITAL_COMMUNITY): Payer: Self-pay | Admitting: Emergency Medicine

## 2014-03-18 ENCOUNTER — Emergency Department (HOSPITAL_COMMUNITY)
Admission: EM | Admit: 2014-03-18 | Discharge: 2014-03-18 | Disposition: A | Discharge status: Home / Self Care | Payer: Medicaid Other | Attending: Emergency Medicine | Admitting: Emergency Medicine

## 2014-03-18 DIAGNOSIS — Z862 Personal history of diseases of the blood and blood-forming organs and certain disorders involving the immune mechanism: Secondary | ICD-10-CM | POA: Insufficient documentation

## 2014-03-18 DIAGNOSIS — I10 Essential (primary) hypertension: Secondary | ICD-10-CM | POA: Insufficient documentation

## 2014-03-18 DIAGNOSIS — M79609 Pain in unspecified limb: Secondary | ICD-10-CM | POA: Diagnosis present

## 2014-03-18 DIAGNOSIS — Z792 Long term (current) use of antibiotics: Secondary | ICD-10-CM | POA: Insufficient documentation

## 2014-03-18 DIAGNOSIS — M722 Plantar fascial fibromatosis: Secondary | ICD-10-CM | POA: Insufficient documentation

## 2014-03-18 DIAGNOSIS — Z79899 Other long term (current) drug therapy: Secondary | ICD-10-CM | POA: Insufficient documentation

## 2014-03-18 MED ORDER — MELOXICAM 15 MG PO TABS
15.0000 mg | ORAL_TABLET | Freq: Every day | ORAL | Status: DC
Start: 2014-03-18 — End: 2020-09-08

## 2014-03-18 NOTE — ED Provider Notes (Signed)
CSN: [id number]     Arrival date & time 03/18/14  2037 History  This chart was scribed for Winifred Olive, working with Toy Cookey, MD found by Elon Spanner, ED Scribe. This patient was seen in room TR11C/TR11C and the patient's care was started at 10:05 PM.    Chief Complaint  Patient presents with  . Foot Problem   The history is provided by the patient. No language interpreter was used.    HPI Comments: Allison Dennis is a 38 y.o. female who presents to the Emergency Department complaining of bilateral foot pain on the dorsal surface as well as a "knot" on the medial aspect of her fourth right toe.  Both onset 2 weeks ago with no change in the dorsal foot pain but a gradual increase in the size of the knot.  She states the pain on the dorsal surface of her feet is better in the morning and typically worsens throughout the day.  She states she stands a lot at her place of employment. She has tried ibuprofen with no relief.     Past Medical History  Diagnosis Date  . Hypertension   . Anemia     history  . SVD (spontaneous vaginal delivery)     x 3 - 1 set of twins   Past Surgical History  Procedure Laterality Date  . Cesarean section      x 1  . Tonsillectomy    . Tubal ligation Bilateral   . Wisdom tooth extraction    . Dilitation & currettage/hystroscopy with hydrothermal ablation N/A 12/18/2013    Procedure: DILATATION & CURETTAGE/HYSTEROSCOPY WITH HYDROTHERMAL ABLATION;  Surgeon: Brock Bad, MD;  Location: [facility];  Service: Gynecology;  Laterality: N/A;   No family history on file. History  Substance Use Topics  . Smoking status: Never Smoker   . Smokeless tobacco: Never Used  . Alcohol Use: Yes     Comment: Socially    OB History   Grav Para Term Preterm Abortions TAB SAB Ect Mult Living   5 4 3 1 1  0 1 0 1 5     Obstetric Comments   Pt had twins with this pregnancy.     Review of Systems  Musculoskeletal: Positive for arthralgias.  All other  systems reviewed and are negative.    Allergies  Review of patient's allergies indicates no known allergies.  Home Medications   Prior to Admission medications   Medication Sig Start Date End Date Taking? Authorizing Provider  clotrimazole (LOTRIMIN) 1 % cream Apply 1 application topically 2 (two) times daily. 01/25/14   Brock Bad, MD  doxycycline (VIBRAMYCIN) 100 MG capsule Take 1 capsule (100 mg total) by mouth 2 (two) times daily. 01/25/14   Brock Bad, MD  ibuprofen (ADVIL,MOTRIN) 800 MG tablet Take 1 tablet (800 mg total) by mouth every 8 (eight) hours as needed. 12/18/13   Brock Bad, MD  lisinopril (PRINIVIL,ZESTRIL) 20 MG tablet TAKE 1 TABLET BY MOUTH EVERY DAY FOR BLOOD PRESSURE 03/26/13   Brock Bad, MD  metroNIDAZOLE (FLAGYL) 500 MG tablet Take 1 po bid x 7 days q 6 weeks x 2, starting 03-08-14. 03/08/14   Brock Bad, MD  metroNIDAZOLE (FLAGYL) 500 MG tablet Take 1 tablet (500 mg total) by mouth 2 (two) times daily. 01/25/14   Brock Bad, MD   There were no vitals taken for this visit. Physical Exam  Nursing note and vitals reviewed. Constitutional: She is oriented  to person, place, and time. She appears well-developed and well-nourished. No distress.  HENT:  Head: Normocephalic and atraumatic.  Eyes: Conjunctivae are normal.  Neck: Normal range of motion.  Cardiovascular: Normal rate and regular rhythm.  Exam reveals no gallop and no friction rub.   No murmur heard. Pulmonary/Chest: Effort normal and breath sounds normal. She has no wheezes. She has no rales. She exhibits no tenderness.  Abdominal: Soft. There is no tenderness.  Musculoskeletal: Normal range of motion.  Tenderness to palpation of bilateral plantar aspect of feet near the heal. No obvious deformity.   Neurological: She is alert and oriented to person, place, and time. Coordination normal.  Speech is goal-oriented. Moves limbs without ataxia.   Skin: Skin is warm and dry.   Callous on lateral aspect of right fourth toe that is tender to palpation.   Psychiatric: She has a normal mood and affect. Her behavior is normal.    ED Course  Procedures (including critical care time)  DIAGNOSTIC STUDIES: Oxygen Saturation is 100% on RA, normal by my interpretation.    COORDINATION OF CARE:  10:09 PM Discussed treatment plan with patient at bedside.  Patient acknowledges and agrees with plan.     Labs Review Labs Reviewed - No data to display  Imaging Review No results found.   EKG Interpretation None      MDM   Final diagnoses:  Plantar fasciitis, bilateral    Patient has bilateral plantar fasciitis and what appears to be a callous on her right fourth toe. Patient instructed to apply ice to her feet and stretch. Patient will have anti inflammatory medications for pain relief.   I personally performed the services described in this documentation, which was scribed in my presence. The recorded information has been reviewed and is accurate.    Emilia BeckKaitlyn Mertice Uffelman, PA-C 03/19/14 [id number]

## 2014-03-18 NOTE — Discharge Instructions (Signed)
Take mobic as needed for pain. Ice your feet for pain relief. Follow up with Podiatrist for further evaluation. Refer to attached documents for more information.

## 2014-03-18 NOTE — ED Notes (Signed)
Pt reports pain located in  Her RT foot on planter side in front of arch. Pt reports she stands at work on hard floors. Pt also reports a sore area between the Lt little toe and next toe.

## 2014-03-19 NOTE — ED Provider Notes (Signed)
Medical screening examination/treatment/procedure(s) were performed by non-physician practitioner and as supervising physician I was immediately available for consultation/collaboration.  Lance Galas, MD 03/19/14 2029 

## 2014-04-19 ENCOUNTER — Ambulatory Visit: Payer: Medicaid Other | Admitting: Obstetrics

## 2014-06-07 ENCOUNTER — Encounter (HOSPITAL_COMMUNITY): Payer: Self-pay | Admitting: Emergency Medicine

## 2014-09-06 ENCOUNTER — Encounter (HOSPITAL_COMMUNITY): Payer: Self-pay | Admitting: *Deleted

## 2014-09-06 ENCOUNTER — Emergency Department (HOSPITAL_COMMUNITY)
Admission: EM | Admit: 2014-09-06 | Discharge: 2014-09-06 | Disposition: A | Discharge status: Home / Self Care | Payer: Medicaid Other | Attending: Emergency Medicine | Admitting: Emergency Medicine

## 2014-09-06 DIAGNOSIS — Z79899 Other long term (current) drug therapy: Secondary | ICD-10-CM | POA: Insufficient documentation

## 2014-09-06 DIAGNOSIS — Z791 Long term (current) use of non-steroidal anti-inflammatories (NSAID): Secondary | ICD-10-CM | POA: Insufficient documentation

## 2014-09-06 DIAGNOSIS — I10 Essential (primary) hypertension: Secondary | ICD-10-CM | POA: Insufficient documentation

## 2014-09-06 DIAGNOSIS — M778 Other enthesopathies, not elsewhere classified: Secondary | ICD-10-CM | POA: Diagnosis not present

## 2014-09-06 DIAGNOSIS — Z862 Personal history of diseases of the blood and blood-forming organs and certain disorders involving the immune mechanism: Secondary | ICD-10-CM | POA: Insufficient documentation

## 2014-09-06 DIAGNOSIS — M779 Enthesopathy, unspecified: Secondary | ICD-10-CM

## 2014-09-06 DIAGNOSIS — R200 Anesthesia of skin: Secondary | ICD-10-CM | POA: Diagnosis not present

## 2014-09-06 DIAGNOSIS — Z792 Long term (current) use of antibiotics: Secondary | ICD-10-CM | POA: Diagnosis not present

## 2014-09-06 DIAGNOSIS — M79602 Pain in left arm: Secondary | ICD-10-CM | POA: Diagnosis present

## 2014-09-06 DIAGNOSIS — M25532 Pain in left wrist: Secondary | ICD-10-CM

## 2014-09-06 NOTE — ED Notes (Signed)
Pt reports Rt arm pain related to her job. Pt lifts heavy objects at work.

## 2014-09-06 NOTE — Progress Notes (Signed)
Orthopedic Tech Progress Note Patient Details:  Georgiana Spinnerliza N Coates 04/20/1976 [id number]  Ortho Devices Type of Ortho Device: Thumb velcro splint Ortho Device/Splint Location: lue Ortho Device/Splint Interventions: Application   Caylie Sandquist 09/06/2014, 12:17 PM

## 2014-09-06 NOTE — ED Provider Notes (Signed)
CSN: [id number]     Arrival date & time 09/06/14  1024 History  This chart was scribed for non-physician practitioner, Raymon Mutton, PA-C, working with Flint Melter, MD by Charline Bills, ED Scribe. This patient was seen in room TR05C/TR05C and the patient's care was started at 12:00 PM.   Chief Complaint  Patient presents with  . Arm Pain   The history is provided by the patient. No language interpreter was used.   HPI Comments: Allison Dennis is a 39 y.o. female, with a h/o HTN and anemia, who presents to the Emergency Department complaining of gradually worsening L wrist pain for the past month. Pt describes pain as a constant, throbbing sensation that radiates into L elbow. Nothing exacerbates pain. She reports associated joint swelling, numbness/tingling sensation. Pt works at [employer] and [employer] where she does a repetitive motion often. Pt denies color change, warmth, fever, chest pain, SOB, difficulty breathing. No overhead lifting, falls, previous injury. Pt is R hand dominant. PCP none    Past Medical History  Diagnosis Date  . Hypertension   . Anemia     history  . SVD (spontaneous vaginal delivery)     x 3 - 1 set of twins   Past Surgical History  Procedure Laterality Date  . Cesarean section      x 1  . Tonsillectomy    . Tubal ligation Bilateral   . Wisdom tooth extraction    . Dilitation & currettage/hystroscopy with hydrothermal ablation N/A 12/18/2013    Procedure: DILATATION & CURETTAGE/HYSTEROSCOPY WITH HYDROTHERMAL ABLATION;  Surgeon: Brock Bad, MD;  Location: [facility];  Service: Gynecology;  Laterality: N/A;   History reviewed. No pertinent family history. History  Substance Use Topics  . Smoking status: Never Smoker   . Smokeless tobacco: Never Used  . Alcohol Use: Yes     Comment: Socially    OB History    Gravida Para Term Preterm AB TAB SAB Ectopic Multiple Living   0 1 0 1 5      Obstetric Comments   Pt had twins with this  pregnancy.     Review of Systems  Musculoskeletal: Positive for joint swelling and arthralgias.  Skin: Negative for color change.   Allergies  Review of patient's allergies indicates no known allergies.  Home Medications   Prior to Admission medications   Medication Sig Start Date End Date Taking? Authorizing Provider  clotrimazole (LOTRIMIN) 1 % cream Apply 1 application topically 2 (two) times daily. 01/25/14   Brock Bad, MD  doxycycline (VIBRAMYCIN) 100 MG capsule Take 1 capsule (100 mg total) by mouth 2 (two) times daily. 01/25/14   Brock Bad, MD  ibuprofen (ADVIL,MOTRIN) 800 MG tablet Take 1 tablet (800 mg total) by mouth every 8 (eight) hours as needed. 12/18/13   Brock Bad, MD  lisinopril (PRINIVIL,ZESTRIL) 20 MG tablet TAKE 1 TABLET BY MOUTH EVERY DAY FOR BLOOD PRESSURE 03/26/13   Brock Bad, MD  meloxicam (MOBIC) 15 MG tablet Take 1 tablet (15 mg total) by mouth daily. 03/18/14   Kaitlyn Szekalski, PA-C  metroNIDAZOLE (FLAGYL) 500 MG tablet Take 1 po bid x 7 days q 6 weeks x 2, starting 03-08-14. 03/08/14   Brock Bad, MD  metroNIDAZOLE (FLAGYL) 500 MG tablet Take 1 tablet (500 mg total) by mouth 2 (two) times daily. 01/25/14   Brock Bad, MD   Triage Vitals: BP 141/69 mmHg  Pulse 71  Resp 16  Ht  (1.626 m)  Wt 305 lb (138.347 kg)  BMI 52.33 kg/m2  SpO2 100%  LMP 09/06/2012 Physical Exam  Constitutional: She is oriented to person, place, and time. She appears well-developed and well-nourished. No distress.  HENT:  Head: Normocephalic and atraumatic.  Eyes: Conjunctivae and EOM are normal. Right eye exhibits no discharge. Left eye exhibits no discharge.  Neck: Normal range of motion. Neck supple.  Cardiovascular: Normal rate, regular rhythm and normal heart sounds.   Pulses:      Radial pulses are 2+ on the right side, and 2+ on the left side.  Pulmonary/Chest: Effort normal and breath sounds normal.  Musculoskeletal: Normal range of  motion. She exhibits tenderness. She exhibits no edema.       Left wrist: She exhibits tenderness. She exhibits normal range of motion, no bony tenderness, no swelling, no effusion and no crepitus.       Arms: Negative swelling, erythema, inflammation, lesions, sores, deformities, malalignments identified to the left wrist. Discomfort upon palpation to the snuffbox region and ulnar aspect of the left wrist. Positive Finkelstein test.  Neurological: She is alert and oriented to person, place, and time. No cranial nerve deficit. She exhibits normal muscle tone. Coordination normal.  Cranial nerves III-XII grossly intact Strength 5+/5+ to upper extremities bilaterally with resistance applied, equal distribution noted Strength intact to MCP, PIP, DIP joints of left hand Sensation intact with differentiation to sharp and dull touch Interphalangeal strength intact   Skin: Skin is warm and dry. No rash noted. She is not diaphoretic. No erythema.  Psychiatric: She has a normal mood and affect. Her behavior is normal. Thought content normal.  Nursing note and vitals reviewed.   ED Course  Procedures (including critical care time) DIAGNOSTIC STUDIES: Oxygen Saturation is 100% on RA, normal by my interpretation.    COORDINATION OF CARE: 12:06 PM-Discussed treatment plan which includes wrist brace and referral to ortho with pt at bedside and pt agreed to plan.   Labs Review Labs Reviewed - No data to display  Imaging Review No results found.   EKG Interpretation None      MDM   Final diagnoses:  Tendinitis  Left wrist pain    Medications - No data to display  Filed Vitals:   09/06/14 1057 09/06/14 1100 09/06/14 1235  BP:  141/69 141/87  Pulse: 71  74  Temp:   98 F (36.7 C)  TempSrc: Oral  Oral  Resp: 16  22  Height:  (1.626 m)    Weight: 305 lb (138.347 kg)    SpO2: 100%  99%   I personally performed the services described in this documentation, which was scribed in  my presence. The recorded information has been reviewed and is accurate.  Patient presenting to the emergency department with left wrist pain that has been ongoing for approximately a month and a half with worsening over the past 1-2 weeks. Patient reports that she does lifting and continuous rotation of the left wrist secondary to her job. Patient denied fall, injury. Doubt septic joint. Doubt compartment syndrome. Negative signs of ischemia. Pulses palpable and strong. Equal grip strength. Sensation intact. Negative focal neurological deficits noted. Suspicion high to be tendinitis-positive Finkelstein sign. Patient continues to perform same motion rotation that could be leading to tendon inflammation. Patient placed in thumb spica brace for comfort purposes-positive mild snuffbox tenderness-appears to be muscular nature. Patient stable, afebrile. Patient septic appearing. Discharged patient.  Referred patient to health and wellness, hand specialist. Discussed with patient to rest, ice, elevate. Discussed with patient to avoid any physical strenuous activity. Discussed with patient to closely monitor symptoms and if symptoms are to worsen or change to report back to the ED - strict return instructions given.  Patient agreed to plan of care, understood, all questions answered.   Raymon MuttonMarissa Amier Hoyt, PA-C 09/06/14 1239  Flint MelterElliott L Wentz, MD 09/06/14 [id number]

## 2014-09-06 NOTE — Progress Notes (Signed)
Orthopedic Tech Progress Note Patient Details:  Georgiana Spinnerliza N Scarpino 12/28/1975 [id number]  Patient ID: Georgiana SpinnerEliza N John, female   DOB: 06/18/1976, 39 y.o.   MRN: [id number] One ortho visit cancelled  Nikki DomCrawford, Jemma Rasp 09/06/2014, 12:18 PM

## 2014-09-06 NOTE — ED Notes (Signed)
Declined W/C at D/C and was escorted to lobby by RN. 

## 2014-09-06 NOTE — Discharge Instructions (Signed)
Please call your doctor for a followup appointment within 24-48 hours. When you talk to your doctor please let them know that you were seen in the emergency department and have them acquire all of your records so that they can discuss the findings with you and formulate a treatment plan to fully care for your new and ongoing problems. Please follow-up with your primary care provider Please follow-up with hand specialist Please keep hand and brace at all times for comfort purposes Please massage with icy hot ointment Please avoid any physical or strenuous activity-please no heavy lifting Please continue to monitor symptoms closely and if symptoms are to worsen or change (fever greater than 101, chills, sweating, nausea, vomiting, chest pain, shortness of breathe, difficulty breathing, weakness, numbness, tingling, worsening or changes to pain pattern, fall, swelling, redness, red streaks, loss of sensation to the hands, decreased ability to hold objects, objects falling out of hand) please report back to the Emergency Department immediately.   Tendinitis Tendinitis is swelling and inflammation of the tendons. Tendons are band-like tissues that connect muscle to bone. Tendinitis commonly occurs in the:   Shoulders (rotator cuff).  Heels (Achilles tendon).  Elbows (triceps tendon). CAUSES Tendinitis is usually caused by overusing the tendon, muscles, and joints involved. When the tissue surrounding a tendon (synovium) becomes inflamed, it is called tenosynovitis. Tendinitis commonly develops in people whose jobs require repetitive motions. SYMPTOMS  Pain.  Tenderness.  Mild swelling. DIAGNOSIS Tendinitis is usually diagnosed by physical exam. Your health care provider may also order X-rays or other imaging tests. TREATMENT Your health care provider may recommend certain medicines or exercises for your treatment. HOME CARE INSTRUCTIONS   Use a sling or splint for as long as directed by  your health care provider until the pain decreases.  Put ice on the injured area.  Put ice in a plastic bag.  Place a towel between your skin and the bag.  Leave the ice on for 15-20 minutes, 3-4 times a day, or as directed by your health care provider.  Avoid using the limb while the tendon is painful. Perform gentle range of motion exercises only as directed by your health care provider. Stop exercises if pain or discomfort increase, unless directed otherwise by your health care provider.  Only take over-the-counter or prescription medicines for pain, discomfort, or fever as directed by your health care provider. SEEK MEDICAL CARE IF:   Your pain and swelling increase.  You develop new, unexplained symptoms, especially increased numbness in the hands. MAKE SURE YOU:   Understand these instructions.  Will watch your condition.  Will get help right away if you are not doing well or get worse. Document Released: 07/20/2000 Document Revised: 12/07/2013 Document Reviewed: 10/09/2010 [facility] Patient Information 2015 [facility] [place], [place]. This information is not intended to replace advice given to you by your health care provider. Make sure you discuss any questions you have with your health care provider.   Emergency Department Resource Guide 1) Find a Doctor and Pay Out of Pocket Although you won't have to find out who is covered by your insurance plan, it is a good idea to ask around and get recommendations. You will then need to call the office and see if the doctor you have chosen will accept you as a new patient and what types of options they offer for patients who are self-pay. Some doctors offer discounts or will set up payment plans for their patients who do not have insurance, but you will  need to ask so you aren't surprised when you get to your appointment.  2) Contact Your Local Health Department Not all health departments have doctors that can see patients for sick visits, but  many do, so it is worth a call to see if yours does. If you don't know where your local health department is, you can check in your phone book. The CDC also has a tool to help you locate your state's health department, and many state websites also have listings of all of their local health departments.  3) Find a Walk-in Clinic If your illness is not likely to be very severe or complicated, you may want to try a walk in clinic. These are popping up all over the country in pharmacies, drugstores, and shopping centers. They're usually staffed by nurse practitioners or physician assistants that have been trained to treat common illnesses and complaints. They're usually fairly quick and inexpensive. However, if you have serious medical issues or chronic medical problems, these are probably not your best option.  No Primary Care Doctor: - Call Health Connect at  [phone] - they can help you locate a primary care doctor that  accepts your insurance, provides certain services, etc. - Physician Referral Service- [phone]  Chronic Pain Problems: Organization         Address  Phone   Notes  [facility]  [phone]) [phone] Patients need to be referred by their primary care doctor.   Medication Assistance: Organization         Address  Phone   Notes  [facility] Medication [facility] [address] [place], [place] [address] [phone] --Must be a resident of [place] [facility] -- Must have NO insurance coverage whatsoever (no Medicaid/ Medicare, etc.) -- The pt. MUST have a primary care doctor that directs their care regularly and follows them in the community   [facility]ist  [phone]   [employer]  [phone]    Agencies that provide inexpensive medical care: Organization         Address  Phone   Notes  [facility]  [phone]   [facility]    [phone]) [phone]   [facility] [address] [place], [place] [address] [phone]   [facility] [address], [place] [phone]) [phone]   [facility]anned [facility]    [phone]) [phone]   [facility]    [phone]   [facility]  [address], [place] Phone:  [phone]) [phone], Fax:  [phone] Hours of Operation:  9 am - 6 pm, M-F.  Also accepts Medicaid/Medicare and self-pay.  [facility]  [address], [place] Phone: [phone], Fax: [phone]. Hours of Operation:  8:30 am - 5:30 pm, M-F.  Also accepts Medicaid and self-pay.  [facility] [place] [address], [place] Phone: [phone]) [phone]   [facility] [address], [place], [place] [phone], Ext. [phone] Mondays & Thursdays: 7-9 AM.  First 15 patients are seen on a first come, first serve basis.    Medicaid-accepting [facility] Providers:  Organization         Address  Phone   Notes  [facility] [address], [place] [phone] Also accepts self-pay patients.  [facility] [address], [address] [place]  (  [phone]) [phone]   [facility] [address], [place] [phone]) [phone]   [facility] [address] [place] [address], [place] [phone]   Renaye RakersVeita Bland [address], [place]   [phone] Only accepts [place] Access [place] patients after they have their name applied to their card.   Self-Pay (no insurance) in [facility]:  Organization         Address  Phone   Notes  Sickle Cell Patients, [facility] [address], [place] [phone]) [phone]   [facility] [address], [place] [phone]) [phone]   [facility] [place]  [address], [place] [phone]   [facility]/Dr. Osei-Bonsu  [address] [place] [address], [place] or  [phone], [place] [phone]) [phone] Phone number for both [place] and [place] locations is the same.  [facility] [address], [place] [phone]) [phone]   [facility] [place] [address] [place] [address], [place] or [address] [phone]   [facility] [address], [place] [phone]) [phone], phone; [phone], fax Sees patients 1st and 3rd Saturday of every month.  Must not qualify for public or private insurance (i.e. Medicaid, Medicare, [place] Health Choice, Veterans' Benefits)  Household income should be no more than 200% of the poverty level The clinic cannot treat you if you are pregnant or think you are pregnant  Sexually transmitted diseases are not treated at the clinic.    Dental Care: Organization         Address  Phone  Notes  [facility] of [facility] [address], [place] [phone]) [phone] Accepts children up to age 10621 who are enrolled in [place] or [place] Health Choice; pregnant women with a Medicaid card; and children who have applied for Medicaid or [place] Health Choice, but were declined, whose parents can pay a reduced fee at time of service.  [facility] of [facility] [place]  [address], [place] [phone] Accepts children up to age 37 who are enrolled in [place] or [place] Health Choice; pregnant women with a Medicaid card; and children who have applied for Medicaid or  Health Choice, but were declined, whose parents can pay a reduced fee at time of service.  [facility]  [address], [place] [phone]) [phone] Patients are seen by appointment only. Walk-ins are not accepted. [facility] will see patients 218 years of age and older. Monday - Tuesday (8am-5pm) Most Wednesdays (8:30-5pm) $30 per visit, cash only  [facility]  [address], [facility]  [place] [phone]) [phone] Patients are seen by appointment only. Walk-ins are not accepted. [facility] will see patients 37 years of age and older. One Wednesday Evening (Monthly: Volunteer Based).  $30 per visit, cash only  [facility]  [phone] for adults; Children under age 674, call Graduate Pediatric Dentistry at [phone]. Children aged 304-14, please call [phone] to request a pediatric application.  Dental services are provided in all areas of dental care including fillings, crowns and bridges, complete and partial dentures, implants, gum treatment, root canals, and extractions. Preventive care is also provided. Treatment is provided to both adults and children. Patients are selected via a lottery and there is  often a waiting list.   [facility] [address], [place]  [phone] [email or web address]   [facility] [address] [place], [place] [phone], Ext. [phone] Second and Fourth Thursday of each month, opens at 6:30 AM; Clinic ends at 9 AM.  Patients are seen on a first-come first-served basis, and a limited number are seen during each clinic.   [facility]  [address] [place] [place], [place] [phone]   Eligibility Requirements You must have lived in [place], [place], or [place] counties for at least the last three months.   You cannot be eligible for state or federal sponsored [place], including [employer], [place], or [employer].   You generally cannot be eligible for healthcare insurance through your employer.    How to apply: Eligibility screenings are held every Tuesday and Wednesday afternoon from 1:00 pm until 4:00 pm. You do not need an appointment for the interview!  [facility] [address] [place], [place] [phone]   [facility] Health Department  [phone]   [facility] Health Department  [phone]   [facility]  Health Department  [phone]    Behavioral Health Resources in the Community: Intensive Outpatient Programs Organization         Address  Phone  Notes  [facility] Services [address], [place], [place] [phone]   [facility] Outpatient [address] [place], [place] [phone]   [facility] [address] [place], [place]  [phone]   [facility] [address],  [place], [place] [phone] or [phone]   Substance Abuse Resources Organization         Address  Phone  Notes  [facility]  5677254328   [facility]  [phone]   [facility]  [phone]   Floydene Flock  [phone]   Residential & Outpatient Substance Abuse Program  [phone]   Psychological Services Organization         Address  Phone  Notes  [facility]  [phone]   [facility] Services  [phone]   [facility] [address], [place] [phone] or [phone]    Mobile Crisis Teams Organization         Address  Phone  Notes  [facility], Mobile Crisis Care Unit  [phone]   [facility]  [address] [place], [place] [phone]   Doristine Locks [address] [place] [phone]    Self-Help/Support Groups Organization         Address  Phone             Notes  [facility] [place] - variety of support groups  336- I7437963 Call for more information  Narcotics Anonymous (NA), [facility] [address], [employer] [place]  2 meetings at this location   Statistician         Address  Phone  Notes  [facility] [address],    [place]  [phone]   [facility]  [address], [place] [address], [place], [place] [phone]   [facility] [address] [place], [place] [phone]  Admissions: 8am-3pm M-F  [facility] [address],    [address] [place], [place] [phone]   [facility] [address] [place], [place], [place] [phone]  [facility] [address]  °[place], [place] [phone]   °[facility] - Intensive Outpatient [address], [place], [place] [phone]   °[facility] ([facility] [address]  °[place], [place] 1-877-615-2722 or [phone]   °[facility] ([facility]) [address] [place], [place] [phone] Accepts Medicaid  °[facility] [address]  °[place] 1-800-659-3381 Substance Abuse/Addiction Treatment  ° °[place] Behavioral Health Resources °Organization         Address  Phone  Notes  °[facility]  (888) [phone]   °Julie Brannon, PhD [address] [place], [place]   (336) [phone] or (336) 951-0000   °[facility]   [address] °[place], [place] ([phone]) [phone]   °[facility] [address], [place], [place] (336) [phone] Insurance/Medicaid/sponsorship through [facility]  °[facility] [address]                                    [place], [place] ([phone]) [phone] Therapy/tele-psych/case  °[facility] [address]  ° [place], [place] ([phone]) [phone]    °Dr. Arfeen  ([phone]) [phone]   °[facility] [place]  [employer] [place] [facility] 1) [address], [place] °2) [address], [place] °3)  [address], [place] (336) [phone] °(336) 342-7768 ° °(336) 342-8140   °[place] Child Abuse Hotline (336) 342-1394 or (336) 342-3537 (After Hours)    ° ° ° °

## 2014-12-08 ENCOUNTER — Ambulatory Visit: Payer: Medicaid Other | Admitting: Obstetrics

## 2015-03-16 ENCOUNTER — Ambulatory Visit: Payer: Medicaid Other | Admitting: Obstetrics

## 2015-11-08 ENCOUNTER — Emergency Department (HOSPITAL_COMMUNITY)
Admission: EM | Admit: 2015-11-08 | Discharge: 2015-11-09 | Disposition: A | Discharge status: Home / Self Care | Payer: No Typology Code available for payment source | Attending: Emergency Medicine | Admitting: Emergency Medicine

## 2015-11-08 ENCOUNTER — Encounter (HOSPITAL_COMMUNITY): Payer: Self-pay

## 2015-11-08 DIAGNOSIS — I10 Essential (primary) hypertension: Secondary | ICD-10-CM | POA: Insufficient documentation

## 2015-11-08 DIAGNOSIS — A599 Trichomoniasis, unspecified: Secondary | ICD-10-CM | POA: Insufficient documentation

## 2015-11-08 DIAGNOSIS — Z3202 Encounter for pregnancy test, result negative: Secondary | ICD-10-CM | POA: Insufficient documentation

## 2015-11-08 DIAGNOSIS — Z862 Personal history of diseases of the blood and blood-forming organs and certain disorders involving the immune mechanism: Secondary | ICD-10-CM | POA: Insufficient documentation

## 2015-11-08 LAB — WET PREP, GENITAL
Sperm: NONE SEEN
YEAST WET PREP: NONE SEEN

## 2015-11-08 LAB — POC URINE PREG, ED: Preg Test, Ur: NEGATIVE

## 2015-11-08 NOTE — ED Notes (Signed)
Pt here with c/o of vaginal itching and brown discharge with foul odor, ongoing for a month. She took Monostat 7  About two weeks ago and that caused her to have vaginal bleeding but denies current vaginal bleeding.

## 2015-11-08 NOTE — ED Provider Notes (Signed)
CSN: [id number]     Arrival date & time 11/08/15  1742 History  By signing my name below, I, Allison Dennis, attest that this documentation has been prepared under the direction and in the presence of Gilda Crease, MD. Electronically Signed: Budd Dennis, ED Scribe. 11/08/2015. 11:10 PM.     Chief Complaint  Patient presents with  . Vaginal Discharge   The history is provided by the patient. No language interpreter was used.   HPI Comments: Allison Dennis is a 40 y.o. female with a PMHx of HTN and anemia who presents to the Emergency Department complaining of brownish-yellow, malodorous vaginal discharge onset 1 month ago. She reports associated vaginal itching. She has tried using monistat 7 2 weeks ago, at which time she had vaginal bleeding, which has since resolved. She notes she has been with the same sexual partner for the past 15 years, but does not discount her symptoms being due to a possible STI. Pt denies pelvic pain, vaginal sores, and any other medical issues.   Past Medical History  Diagnosis Date  . Hypertension   . Anemia     history  . SVD (spontaneous vaginal delivery)     x 3 - 1 set of twins   Past Surgical History  Procedure Laterality Date  . Cesarean section      x 1  . Tonsillectomy    . Tubal ligation Bilateral   . Wisdom tooth extraction    . Dilitation & currettage/hystroscopy with hydrothermal ablation N/A 12/18/2013    Procedure: DILATATION & CURETTAGE/HYSTEROSCOPY WITH HYDROTHERMAL ABLATION;  Surgeon: Brock Bad, MD;  Location: [facility];  Service: Gynecology;  Laterality: N/A;   No family history on file. Social History  Substance Use Topics  . Smoking status: Never Smoker   . Smokeless tobacco: Never Used  . Alcohol Use: Yes     Comment: Socially    OB History    Gravida Para Term Preterm AB TAB SAB Ectopic Multiple Living   0 1 0 1 5      Obstetric Comments   Pt had twins with this pregnancy.     Review of Systems   Genitourinary: Positive for vaginal discharge. Negative for vaginal bleeding (2 weeks ago, resolved), vaginal pain and pelvic pain.  All other systems reviewed and are negative.   Allergies  Review of patient's allergies indicates no known allergies.  Home Medications   Prior to Admission medications   Medication Sig Start Date End Date Taking? Authorizing Provider  clotrimazole (LOTRIMIN) 1 % cream Apply 1 application topically 2 (two) times daily. Patient not taking: Reported on 11/08/2015 01/25/14   Brock Bad, MD  doxycycline (VIBRAMYCIN) 100 MG capsule Take 1 capsule (100 mg total) by mouth 2 (two) times daily. Patient not taking: Reported on 11/08/2015 01/25/14   Brock Bad, MD  ibuprofen (ADVIL,MOTRIN) 800 MG tablet Take 1 tablet (800 mg total) by mouth every 8 (eight) hours as needed. Patient not taking: Reported on 11/08/2015 12/18/13   Brock Bad, MD  lisinopril (PRINIVIL,ZESTRIL) 20 MG tablet TAKE 1 TABLET BY MOUTH EVERY DAY FOR BLOOD PRESSURE Patient not taking: Reported on 11/08/2015 03/26/13   Brock Bad, MD  meloxicam (MOBIC) 15 MG tablet Take 1 tablet (15 mg total) by mouth daily. Patient not taking: Reported on 11/08/2015 03/18/14   Emilia Beck, PA-C  metroNIDAZOLE (FLAGYL) 500 MG tablet Take 1 po bid x 7 days q 6 weeks  x 2, starting 03-08-14. Patient not taking: Reported on 11/08/2015 03/08/14   Brock Badharles A Harper, MD  metroNIDAZOLE (FLAGYL) 500 MG tablet Take 1 tablet (500 mg total) by mouth 2 (two) times daily. Patient not taking: Reported on 11/08/2015 01/25/14   Brock Badharles A Harper, MD   BP 125/67 mmHg  Pulse 84  Temp(Src) 99.1 F (37.3 C) (Oral)  Resp 16  SpO2 100% Physical Exam  Constitutional: She is oriented to person, place, and time. She appears well-developed and well-nourished. No distress.  HENT:  Head: Normocephalic and atraumatic.  Right Ear: Hearing normal.  Left Ear: Hearing normal.  Nose: Nose normal.  Mouth/Throat: Oropharynx is clear and  moist and mucous membranes are normal.  Eyes: Conjunctivae and EOM are normal. Pupils are equal, round, and reactive to light.  Neck: Normal range of motion. Neck supple.  Cardiovascular: Regular rhythm, S1 normal and S2 normal.  Exam reveals no gallop and no friction rub.   No murmur heard. Pulmonary/Chest: Effort normal and breath sounds normal. No respiratory distress. She exhibits no tenderness.  Abdominal: Soft. Normal appearance and bowel sounds are normal. There is no hepatosplenomegaly. There is no tenderness. There is no rebound, no guarding, no tenderness at McBurney's point and negative Murphy's sign. No hernia.  Genitourinary: There is no rash or tenderness on the right labia. There is no rash or tenderness on the left labia. Cervix exhibits no motion tenderness. Right adnexum displays no mass and no tenderness. Left adnexum displays no mass and no tenderness. No erythema in the vagina. No foreign body around the vagina. No signs of injury around the vagina. Vaginal discharge (yellow) found.  Musculoskeletal: Normal range of motion.  Neurological: She is alert and oriented to person, place, and time. She has normal strength. No cranial nerve deficit or sensory deficit. Coordination normal. GCS eye subscore is 4. GCS verbal subscore is 5. GCS motor subscore is 6.  Skin: Skin is warm, dry and intact. No rash noted. No cyanosis.  Psychiatric: She has a normal mood and affect. Her speech is normal and behavior is normal. Thought content normal.  Nursing note and vitals reviewed.   ED Course  Procedures  DIAGNOSTIC STUDIES: Oxygen Saturation is 100% on RA, normal by my interpretation.    COORDINATION OF CARE: 11:04 PM - Discussed plans to perform a pelvic exam. Pt advised of plan for treatment and pt agrees.  11:10 PM - Returned with female chaperone for pelvic exam.  Labs Review Labs Reviewed  WET PREP, GENITAL - Abnormal; Notable for the following:    Trich, Wet Prep PRESENT (*)     Clue Cells Wet Prep HPF POC PRESENT (*)    WBC, Wet Prep HPF POC MANY (*)    All other components within normal limits  POC URINE PREG, ED  GC/CHLAMYDIA PROBE AMP ([place]) [facility]    Imaging Review No results found. I have personally reviewed and evaluated these images and lab results as part of my medical decision-making.   EKG Interpretation None      MDM   Final diagnoses:  None  Trichomoniasis  Presents to the ER for evaluation of one week of progressively worsening vaginal discharge. Examination did reveal copious yellow discharge present. She did not have any significant cervical motion tenderness. Wet prep shows Trichomonas present. Patient informed of this, counseled that this is a sexually transmitted disease. She was treated empirically with Rocephin, Zithromax, Flagyl here in the ER and will follow-up with OB/GYN.  I personally performed the services described in this documentation, which was scribed in my presence. The recorded information has been reviewed and is accurate.    Gilda Crease, MD 11/09/15 [phone]

## 2015-11-09 LAB — GC/CHLAMYDIA PROBE AMP (~~LOC~~) NOT AT ARMC
CHLAMYDIA, DNA PROBE: NEGATIVE
NEISSERIA GONORRHEA: NEGATIVE

## 2015-11-09 MED ORDER — METRONIDAZOLE 500 MG PO TABS
2000.0000 mg | ORAL_TABLET | Freq: Once | ORAL | Status: AC
  Administered 2015-11-09: 2000 mg via ORAL
  Filled 2015-11-09: qty 4

## 2015-11-09 MED ORDER — LIDOCAINE HCL (PF) 1 % IJ SOLN
2.0000 mL | Freq: Once | INTRAMUSCULAR | Status: AC
  Administered 2015-11-09: 2 mL
  Filled 2015-11-09: qty 5

## 2015-11-09 MED ORDER — CEFTRIAXONE SODIUM 250 MG IJ SOLR
250.0000 mg | Freq: Once | INTRAMUSCULAR | Status: AC
  Administered 2015-11-09: 250 mg via INTRAMUSCULAR
  Filled 2015-11-09: qty 250

## 2015-11-09 MED ORDER — AZITHROMYCIN 250 MG PO TABS
1000.0000 mg | ORAL_TABLET | Freq: Once | ORAL | Status: AC
  Administered 2015-11-09: 1000 mg via ORAL
  Filled 2015-11-09: qty 4

## 2015-11-09 NOTE — Discharge Instructions (Signed)
Sexually Transmitted Disease °A sexually transmitted disease (STD) is a disease or infection that may be passed (transmitted) from person to person, usually during sexual activity. This may happen by way of saliva, semen, blood, vaginal mucus, or urine. Common STDs include: °· Gonorrhea. °· Chlamydia. °· Syphilis. °· HIV and AIDS. °· Genital herpes. °· Hepatitis B and C. °· Trichomonas. °· Human papillomavirus (HPV). °· Pubic lice. °· Scabies. °· Mites. °· Bacterial vaginosis. °WHAT ARE CAUSES OF STDs? °An STD may be caused by bacteria, a virus, or parasites. STDs are often transmitted during sexual activity if one person is infected. However, they may also be transmitted through nonsexual means. STDs may be transmitted after:  °· Sexual intercourse with an infected person. °· Sharing sex toys with an infected person. °· Sharing needles with an infected person or using unclean piercing or tattoo needles. °· Having intimate contact with the genitals, mouth, or rectal areas of an infected person. °· Exposure to infected fluids during birth. °WHAT ARE THE SIGNS AND SYMPTOMS OF STDs? °Different STDs have different symptoms. Some people may not have any symptoms. If symptoms are present, they may include: °· Painful or bloody urination. °· Pain in the pelvis, abdomen, vagina, anus, throat, or eyes. °· A skin rash, itching, or irritation. °· Growths, ulcerations, blisters, or sores in the genital and anal areas. °· Abnormal vaginal discharge with or without bad odor. °· Penile discharge in men. °· Fever. °· Pain or bleeding during sexual intercourse. °· Swollen glands in the groin area. °· Yellow skin and eyes (jaundice). This is seen with hepatitis. °· Swollen testicles. °· Infertility. °· Sores and blisters in the mouth. °HOW ARE STDs DIAGNOSED? °To make a diagnosis, your health care provider may: °· Take a medical history. °· Perform a physical exam. °· Take a sample of any discharge to examine. °· Swab the throat,  cervix, opening to the penis, rectum, or vagina for testing. °· Test a sample of your first morning urine. °· Perform blood tests. °· Perform a Pap test, if this applies. °· Perform a colposcopy. °· Perform a laparoscopy. °HOW ARE STDs TREATED? °Treatment depends on the STD. Some STDs may be treated but not cured. °· Chlamydia, gonorrhea, trichomonas, and syphilis can be cured with antibiotic medicine. °· Genital herpes, hepatitis, and HIV can be treated, but not cured, with prescribed medicines. The medicines lessen symptoms. °· Genital warts from HPV can be treated with medicine or by freezing, burning (electrocautery), or surgery. Warts may come back. °· HPV cannot be cured with medicine or surgery. However, abnormal areas may be removed from the cervix, vagina, or vulva. °· If your diagnosis is confirmed, your recent sexual partners need treatment. This is true even if they are symptom-free or have a negative culture or evaluation. They should not have sex until their health care providers say it is okay. °· Your health care provider may test you for infection again 3 months after treatment. °HOW CAN I REDUCE MY RISK OF GETTING AN STD? °Take these steps to reduce your risk of getting an STD: °· Use latex condoms, dental dams, and water-soluble lubricants during sexual activity. Do not use petroleum jelly or oils. °· Avoid having multiple sex partners. °· Do not have sex with someone who has other sex partners °· Do not have sex with anyone you do not know or who is at high risk for an STD. °· Avoid risky sex practices that can break your skin. °· Do not have sex   if you have open sores on your mouth or skin. °· Avoid drinking too much alcohol or taking illegal drugs. Alcohol and drugs can affect your judgment and put you in a vulnerable position. °· Avoid engaging in oral and anal sex acts. °· Get vaccinated for HPV and hepatitis. If you have not received these vaccines in the past, talk to your health care  provider about whether one or both might be right for you. °· If you are at risk of being infected with HIV, it is recommended that you take a prescription medicine daily to prevent HIV infection. This is called pre-exposure prophylaxis (PrEP). You are considered at risk if: °· You are a man who has sex with other men (MSM). °· You are a heterosexual man or woman and are sexually active with more than one partner. °· You take drugs by injection. °· You are sexually active with a partner who has HIV. °· Talk with your health care provider about whether you are at high risk of being infected with HIV. If you choose to begin PrEP, you should first be tested for HIV. You should then be tested every 3 months for as long as you are taking PrEP. °WHAT SHOULD I DO IF I THINK I HAVE AN STD? °· See your health care provider. °· Tell your sexual partner(s). They should be tested and treated for any STDs. °· Do not have sex until your health care provider says it is okay. °WHEN SHOULD I GET IMMEDIATE MEDICAL CARE? °Contact your health care provider right away if:  °· You have severe abdominal pain. °· You are a man and notice swelling or pain in your testicles. °· You are a woman and notice swelling or pain in your vagina. °  °This information is not intended to replace advice given to you by your health care provider. Make sure you discuss any questions you have with your health care provider. °  °Document Released: 10/13/2002 Document Revised: 08/13/2014 Document Reviewed: 02/10/2013 °Elsevier Interactive Patient Education ©2016 Else[employer] Inc[employer] ° °Trichomoniasis °Trichomoniasis is an infection caused by an organism called Trichomonas. The infection can affect both women and men. In women, the outer female genitalia and the vagina are affected. In men, the penis is mainly affected, but the prostate and other reproductive organs can also be involved. Trichomoniasis is a sexually transmitted infection (STI) and is most often  passed to another person through sexual contact.  °RISK FACTORS °· Having unprotected sexual intercourse. °· Having sexual intercourse with an infected partner. °SIGNS AND SYMPTOMS  °Symptoms of trichomoniasis in women include: °· Abnormal gray-green frothy vaginal discharge. °· Itching and irritation of the vagina. °· Itching and irritation of the area outside the vagina. °Symptoms of trichomoniasis in men include:  °· Penile discharge with or without pain. °· Pain during urination. This results from inflammation of the urethra. °DIAGNOSIS  °Trichomoniasis may be found during a Pap test or physical exam. Your health care provider may use one of the following methods to help diagnose this infection: °· Testing the pH of the vagina with a test tape. °· Using a vaginal swab test that checks for the Trichomonas organism. A test is available that provides results within a few minutes. °· Examining a urine sample. °· Testing vaginal secretions. °Your health care provider may test you for other STIs, including HIV. °TREATMENT  °· You may be given medicine to fight the infection. Women should inform their health care provider if they could be or   are pregnant. Some medicines used to treat the infection should not be taken during pregnancy. °· Your health care provider may recommend over-the-counter medicines or creams to decrease itching or irritation. °· Your sexual partner will need to be treated if infected. °· Your health care provider may test you for infection again 3 months after treatment. °HOME CARE INSTRUCTIONS  °· Take medicines only as directed by your health care provider. °· Take over-the-counter medicine for itching or irritation as directed by your health care provider. °· Do not have sexual intercourse while you have the infection. °· Women should not douche or wear tampons while they have the infection. °· Discuss your infection with your partner. Your partner may have gotten the infection from you, or you  may have gotten it from your partner. °· Have your sex partner get examined and treated if necessary. °· Practice safe, informed, and protected sex. °· See your health care provider for other STI testing. °SEEK MEDICAL CARE IF:  °· You still have symptoms after you finish your medicine. °· You develop abdominal pain. °· You have pain when you urinate. °· You have bleeding after sexual intercourse. °· You develop a rash. °· Your medicine makes you sick or makes you throw up (vomit). °MAKE SURE YOU: °· Understand these instructions. °· Will watch your condition. °· Will get help right away if you are not doing well or get worse. °  °This information is not intended to replace advice given to you by your health care provider. Make sure you discuss any questions you have with your health care provider. °  °Document Released: 01/16/2001 Document Revised: 08/13/2014 Document Reviewed: 05/04/2013 °Elsevier Interactive Patient Education ©2016 Elsevier Inc. ° °

## 2016-09-25 ENCOUNTER — Encounter (HOSPITAL_COMMUNITY): Payer: Self-pay

## 2016-09-25 ENCOUNTER — Emergency Department (HOSPITAL_COMMUNITY)
Admission: EM | Admit: 2016-09-25 | Discharge: 2016-09-25 | Disposition: A | Discharge status: Home / Self Care | Payer: Medicaid Other | Attending: Emergency Medicine | Admitting: Emergency Medicine

## 2016-09-25 DIAGNOSIS — H1033 Unspecified acute conjunctivitis, bilateral: Secondary | ICD-10-CM | POA: Insufficient documentation

## 2016-09-25 DIAGNOSIS — I10 Essential (primary) hypertension: Secondary | ICD-10-CM | POA: Insufficient documentation

## 2016-09-25 MED ORDER — TOBRAMYCIN 0.3 % OP SOLN: 2.0000 [drp] | OPHTHALMIC | 0 refills | Status: DC

## 2016-09-25 NOTE — ED Provider Notes (Signed)
[facility]-EMERGENCY DEPT Provider Note   CSN: [id number] Arrival date & time: 09/25/16  4098  By signing my name below, I, Marnette Burgess Long, attest that this documentation has been prepared under the direction and in the presence of Ponciano Ort Gardner, [place]. Electronically Signed: Marnette Burgess Long, Scribe. 09/25/2016. 10:39 AM.    History   Chief Complaint No chief complaint on file.  The history is provided by the patient. No language interpreter was used.   HPI Comments:  Allison Dennis is a 41 y.o. female with a PMHx of Anemia and HTN, who presents to the Emergency Department complaining of bilateral eye redness, burning, and itching onset two days. She reports waking up this morning unable to open her eye as they were stuck together. She states her daughter may have had pink eye earlier this week. She did not try any Tx at home for relief of her symptoms. Pt denies fever, chills, cough, rhinorrhea, and any other associated symptoms at this time.  Past Medical History:  Diagnosis Date  . Anemia    history  . Hypertension   . SVD (spontaneous vaginal delivery)    x 3 - 1 set of twins    Patient Active Problem List   Diagnosis Date Noted  . Candidiasis of vulva and vagina 08/13/2013  . Excessive or frequent menstruation 12/31/2012  . Urinary tract infection, site not specified 12/31/2012    Past Surgical History:  Procedure Laterality Date  . CESAREAN SECTION     x 1  . DILITATION & CURRETTAGE/HYSTROSCOPY WITH HYDROTHERMAL ABLATION N/A 12/18/2013   Procedure: DILATATION & CURETTAGE/HYSTEROSCOPY WITH HYDROTHERMAL ABLATION;  Surgeon: Brock Bad, MD;  Location: [facility];  Service: Gynecology;  Laterality: N/A;  . TONSILLECTOMY    . TUBAL LIGATION Bilateral   . WISDOM TOOTH EXTRACTION      OB History    Gravida Para Term Preterm AB Living   5 4 3 1 1 5    SAB TAB Ectopic Multiple Live Births   1 0 0 1 5      Obstetric Comments   Pt had twins with this pregnancy.        Home Medications    Prior to Admission medications   Medication Sig Start Date End Date Taking? Authorizing Provider  clotrimazole (LOTRIMIN) 1 % cream Apply 1 application topically 2 (two) times daily. Patient not taking: Reported on 11/08/2015 01/25/14   Brock Bad, MD  doxycycline (VIBRAMYCIN) 100 MG capsule Take 1 capsule (100 mg total) by mouth 2 (two) times daily. Patient not taking: Reported on 11/08/2015 01/25/14   Brock Bad, MD  ibuprofen (ADVIL,MOTRIN) 800 MG tablet Take 1 tablet (800 mg total) by mouth every 8 (eight) hours as needed. Patient not taking: Reported on 11/08/2015 12/18/13   Brock Bad, MD  lisinopril (PRINIVIL,ZESTRIL) 20 MG tablet TAKE 1 TABLET BY MOUTH EVERY DAY FOR BLOOD PRESSURE Patient not taking: Reported on 11/08/2015 03/26/13   Brock Bad, MD  meloxicam (MOBIC) 15 MG tablet Take 1 tablet (15 mg total) by mouth daily. Patient not taking: Reported on 11/08/2015 03/18/14   Emilia Beck, PA-C  metroNIDAZOLE (FLAGYL) 500 MG tablet Take 1 po bid x 7 days q 6 weeks x 2, starting 03-08-14. Patient not taking: Reported on 11/08/2015 03/08/14   Brock Bad, MD  metroNIDAZOLE (FLAGYL) 500 MG tablet Take 1 tablet (500 mg total) by mouth 2 (two) times daily. Patient not taking: Reported on 11/08/2015 01/25/14  Brock Badharles A Harper, MD    Family History No family history on file.  Social History Social History  Substance Use Topics  . Smoking status: Never Smoker  . Smokeless tobacco: Never Used  . Alcohol use Yes     Comment: Socially      Allergies   Patient has no known allergies.   Review of Systems Review of Systems  Constitutional: Negative for chills and fever.  HENT: Negative for rhinorrhea.   Eyes: Positive for redness and itching.  Respiratory: Negative for cough.   All other systems reviewed and are negative.    Physical Exam Updated Vital Signs BP 147/94 (BP Location: Left Arm)   Pulse 71   Temp 98.6 F (37 C) (Oral)    Resp 18   SpO2 97%   Physical Exam  Constitutional: She is oriented to person, place, and time. She appears well-developed and well-nourished.  HENT:  Head: Normocephalic.  Eyes: Conjunctivae are normal.  Bilateral injected conjunctivae. Exudate on bilateral lower eyelids.   Cardiovascular: Normal rate.   Pulmonary/Chest: Effort normal.  Abdominal: She exhibits no distension.  Musculoskeletal: Normal range of motion.  Neurological: She is alert and oriented to person, place, and time.  Skin: Skin is warm and dry.  Psychiatric: She has a normal mood and affect.  Nursing note and vitals reviewed.    ED Treatments / Results  DIAGNOSTIC STUDIES:  Oxygen Saturation is 97% on RA, normal by my interpretation.    COORDINATION OF CARE:  10:40 AM Discussed treatment plan with pt at bedside including eye Abx and pt agreed to plan.  Labs (all labs ordered are listed, but only abnormal results are displayed) Labs Reviewed - No data to display  EKG  EKG Interpretation None       Radiology No results found.  Procedures Procedures (including critical care time)  Medications Ordered in ED Medications - No data to display   Initial Impression / Assessment and Plan / ED Course  I have reviewed the triage vital signs and the nursing notes.  Pertinent labs & imaging results that were available during my care of the patient were reviewed by me and considered in my medical decision making (see chart for details).       Final Clinical Impressions(s) / ED Diagnoses   Final diagnoses:  Acute bacterial conjunctivitis of both eyes    New Prescriptions Discharge Medication List as of 09/25/2016 10:45 AM    START taking these medications   Details  tobramycin (TOBREX) 0.3 % ophthalmic solution Place 2 drops into both eyes every 4 (four) hours., Starting Tue 09/25/2016, Print       An After Visit Summary was printed and given to the patient.  I personally performed the  services in this documentation, which was scribed in my presence.  The recorded information has been reviewed and considered.   Barnet PallKaren SofiaPAC.     Lonia SkinnerLeslie K TaylorSofia, PA-C 09/25/16 1335    Alvira MondayErin Schlossman, MD 09/27/16 [id number]

## 2016-09-25 NOTE — ED Triage Notes (Signed)
Patient here with bilateral eye redness with itching x 2 days, NAD

## 2017-08-14 ENCOUNTER — Encounter (HOSPITAL_COMMUNITY): Payer: Self-pay | Admitting: Emergency Medicine

## 2017-08-14 ENCOUNTER — Emergency Department (HOSPITAL_COMMUNITY)
Admission: EM | Admit: 2017-08-14 | Discharge: 2017-08-14 | Disposition: A | Discharge status: Home / Self Care | Payer: Self-pay | Attending: Emergency Medicine | Admitting: Emergency Medicine

## 2017-08-14 DIAGNOSIS — B309 Viral conjunctivitis, unspecified: Secondary | ICD-10-CM | POA: Insufficient documentation

## 2017-08-14 DIAGNOSIS — R350 Frequency of micturition: Secondary | ICD-10-CM | POA: Insufficient documentation

## 2017-08-14 DIAGNOSIS — I10 Essential (primary) hypertension: Secondary | ICD-10-CM | POA: Insufficient documentation

## 2017-08-14 DIAGNOSIS — N898 Other specified noninflammatory disorders of vagina: Secondary | ICD-10-CM | POA: Insufficient documentation

## 2017-08-14 LAB — CBG MONITORING, ED: GLUCOSE-CAPILLARY: 98 mg/dL (ref 65–99)

## 2017-08-14 LAB — URINALYSIS, ROUTINE W REFLEX MICROSCOPIC
Bilirubin Urine: NEGATIVE
Glucose, UA: NEGATIVE mg/dL
Hgb urine dipstick: NEGATIVE
Ketones, ur: NEGATIVE mg/dL
LEUKOCYTES UA: NEGATIVE
Nitrite: NEGATIVE
Protein, ur: NEGATIVE mg/dL
Specific Gravity, Urine: 1.025 (ref 1.005–1.030)
pH: 5 (ref 5.0–8.0)

## 2017-08-14 LAB — WET PREP, GENITAL
Clue Cells Wet Prep HPF POC: NONE SEEN
Sperm: NONE SEEN
Trich, Wet Prep: NONE SEEN
Yeast Wet Prep HPF POC: NONE SEEN

## 2017-08-14 LAB — POC URINE PREG, ED: PREG TEST UR: NEGATIVE

## 2017-08-14 MED ORDER — NAPHAZOLINE-PHENIRAMINE 0.025-0.3 % OP SOLN: 1.0000 [drp] | Freq: Four times a day (QID) | OPHTHALMIC | 0 refills | Status: DC | PRN

## 2017-08-14 NOTE — Discharge Instructions (Signed)
Conjunctivitis is very contagious. Try to avoid rubbing eyes. Apply warm compresses and use prescribed eye drops as directed.  You may also use over-the-counter allergy medications to help with your nasal congestion and eye itching.  Follow-up with an ophthalmologist if symptoms persist.  Return to the ED if any concerning signs or symptoms develop such as fevers, swelling of the eyelids, or pain with eye movements.   Your workup today did not show any evidence of an infection to the bladder or vagina.  Follow-up with an OB/GYN for reevaluation of symptoms.  If any outstanding lab work is abnormal, you will be contacted and informed.

## 2017-08-14 NOTE — ED Provider Notes (Signed)
[facility] EMERGENCY DEPARTMENT Provider Note   CSN: [id number] Arrival date & time: 08/14/17  1207     History   Chief Complaint Chief Complaint  Patient presents with  . Polyuria  . Vaginal Discharge    HPI Allison Dennis is a 42 y.o. female with history of anemia, hypertension presents today for evaluation of acute onset, progressively worsening urinary frequency and abnormal vaginal discharge for 1 month.  She states that discharge is brown and malodorous.  She also notes that her urine has been slightly darker than usual.  Denies dysuria or hematuria.  She is currently sexually active with one female partner and does not always use protection.  Denies abdominal pain, nausea, vomiting, or change in bowel movements.  Has not tried anything for her symptoms.  She is also complaining of bilateral eye itching and drainage.  She states the drainage occurs in the mornings when she awakes she has yellow crusting to her eyelashes.  She endorses mild erythema and tearing to the eyes intermittently.  Denies foreign body sensation.  No history of trauma.  No fevers.  No vision changes.  She also notes nasal congestion for the same amount of time.  She has not tried anything for her symptoms.  Denies sore throat, headache, ear pain, or neck pain.   The history is provided by the patient.    Past Medical History:  Diagnosis Date  . Anemia    history  . Hypertension   . SVD (spontaneous vaginal delivery)    x 3 - 1 set of twins    Patient Active Problem List   Diagnosis Date Noted  . Candidiasis of vulva and vagina 08/13/2013  . Excessive or frequent menstruation 12/31/2012  . Urinary tract infection, site not specified 12/31/2012    Past Surgical History:  Procedure Laterality Date  . CESAREAN SECTION     x 1  . DILITATION & CURRETTAGE/HYSTROSCOPY WITH HYDROTHERMAL ABLATION N/A 12/18/2013   Procedure: DILATATION & CURETTAGE/HYSTEROSCOPY WITH HYDROTHERMAL ABLATION;   Surgeon: Brock Bad, MD;  Location: [facility];  Service: Gynecology;  Laterality: N/A;  . TONSILLECTOMY    . TUBAL LIGATION Bilateral   . WISDOM TOOTH EXTRACTION      OB History    Gravida Para Term Preterm AB Living   5 4 3 1 1 5    SAB TAB Ectopic Multiple Live Births   1 0 0 1 5      Obstetric Comments   Pt had twins with this pregnancy.       Home Medications    Prior to Admission medications   Medication Sig Start Date End Date Taking? Authorizing Provider  clotrimazole (LOTRIMIN) 1 % cream Apply 1 application topically 2 (two) times daily. Patient not taking: Reported on 11/08/2015 01/25/14   Brock Bad, MD  doxycycline (VIBRAMYCIN) 100 MG capsule Take 1 capsule (100 mg total) by mouth 2 (two) times daily. Patient not taking: Reported on 11/08/2015 01/25/14   Brock Bad, MD  ibuprofen (ADVIL,MOTRIN) 800 MG tablet Take 1 tablet (800 mg total) by mouth every 8 (eight) hours as needed. Patient not taking: Reported on 11/08/2015 12/18/13   Brock Bad, MD  lisinopril (PRINIVIL,ZESTRIL) 20 MG tablet TAKE 1 TABLET BY MOUTH EVERY DAY FOR BLOOD PRESSURE Patient not taking: Reported on 11/08/2015 03/26/13   Brock Bad, MD  meloxicam (MOBIC) 15 MG tablet Take 1 tablet (15 mg total) by mouth daily. Patient not taking: Reported on  11/08/2015 03/18/14   Emilia Beck, PA-C  metroNIDAZOLE (FLAGYL) 500 MG tablet Take 1 po bid x 7 days q 6 weeks x 2, starting 03-08-14. Patient not taking: Reported on 11/08/2015 03/08/14   Brock Bad, MD  metroNIDAZOLE (FLAGYL) 500 MG tablet Take 1 tablet (500 mg total) by mouth 2 (two) times daily. Patient not taking: Reported on 11/08/2015 01/25/14   Brock Bad, MD  naphazoline-pheniramine (NAPHCON-A) 0.025-0.3 % ophthalmic solution Place 1 drop into both eyes 4 (four) times daily as needed for eye irritation. 08/14/17   Amadi Yoshino A, PA-C  tobramycin (TOBREX) 0.3 % ophthalmic solution Place 2 drops into both eyes every 4 (four)  hours. 09/25/16   Elson Areas, PA-C    Family History No family history on file.  Social History Social History   Tobacco Use  . Smoking status: Never Smoker  . Smokeless tobacco: Never Used  Substance Use Topics  . Alcohol use: Yes    Comment: Socially   . Drug use: No     Allergies   Patient has no known allergies.   Review of Systems Review of Systems  Constitutional: Negative for chills and fever.  Eyes: Positive for discharge, redness and itching. Negative for photophobia, pain and visual disturbance.  Gastrointestinal: Negative for abdominal pain, blood in stool, constipation, nausea and vomiting.  Genitourinary: Positive for frequency and vaginal discharge. Negative for dysuria, hematuria, vaginal bleeding and vaginal pain.  Neurological: Negative for headaches.  All other systems reviewed and are negative.    Physical Exam Updated Vital Signs BP (!) 152/111 (BP Location: Left Arm)   Pulse 79   Temp 98.3 F (36.8 C) (Oral)   Resp 18   Ht 5\' 5"  (1.651 m)   Wt (!) 143.8 kg (317 lb)   SpO2 99%   BMI 52.75 kg/m    Physical Exam  Constitutional: She appears well-developed and well-nourished. No distress.  HENT:  Head: Normocephalic and atraumatic.  Right Ear: External ear normal.  Left Ear: External ear normal.  Eyes: Conjunctivae and EOM are normal. Pupils are equal, round, and reactive to light. No scleral icterus.  Mild conjunctival injection of the bilateral eyes.  No swelling of the eyelids.  No pain with EOMs.  No abnormal drainage.  No foreign bodies noted.  Neck: Normal range of motion. Neck supple. No JVD present. No tracheal deviation present.  Cardiovascular: Normal rate and intact distal pulses.  Pulmonary/Chest: Effort normal and breath sounds normal.  Abdominal: Soft. Bowel sounds are normal. She exhibits no distension. There is no tenderness. There is no guarding.  No CVA tenderness  Genitourinary:  Genitourinary Comments: Examination  performed in the presence of a chaperone.  No genital lesions noted.  Moderate amount of malodorous yellow discharge in the vaginal vault.  Cervix is not friable in appearance with no petechia.  No cervical motion tenderness, no adnexal tenderness.  Examination of the uterus was limited due to body habitus.  Musculoskeletal: She exhibits no edema.  Lymphadenopathy:    She has no cervical adenopathy.  Neurological: She is alert.  Skin: Skin is warm and dry. No erythema.  Psychiatric: She has a normal mood and affect. Her behavior is normal.  Nursing note and vitals reviewed.    ED Treatments / Results  Labs (all labs ordered are listed, but only abnormal results are displayed) Labs Reviewed  WET PREP, GENITAL - Abnormal; Notable for the following components:      Result Value   WBC,  Wet Prep HPF POC MANY (*)    All other components within normal limits  URINALYSIS, ROUTINE W REFLEX MICROSCOPIC - Abnormal; Notable for the following components:   APPearance HAZY (*)    All other components within normal limits  RPR  HIV ANTIBODY (ROUTINE TESTING)  POC URINE PREG, ED  CBG MONITORING, ED  GC/CHLAMYDIA PROBE AMP ([place]) NOT AT [facility]    EKG  EKG Interpretation None       Radiology No results found.  Procedures Procedures (including critical care time)  Medications Ordered in ED Medications - No data to display   Initial Impression / Assessment and Plan / ED Course  I have reviewed the triage vital signs and the nursing notes.  Pertinent labs & imaging results that were available during my care of the patient were reviewed by me and considered in my medical decision making (see chart for details).     Patient presents with urinary frequency and vaginal discharge for 1 month.  Also complains of mild bilateral eye conjunctival injection and eye crusting in the mornings. Patient presentation consistent with viral conjunctivitis.  No purulent discharge, entrapment,  consensual photophobia.  Presentation non-concerning for iritis, bacterial conjunctivitis, corneal abrasions, or HSV.  No antibiotics are indicated and patient will be prescribed naphazoline for itching.  Personal hygiene and frequent handwashing discussed.  Patient advised to followup with ophthalmologist if symptoms persist or worsen in any way including vision change or purulent discharge.  Abdominal examination is unremarkable.  No tenderness.  UA is not concerning for UTI or nephrolithiasis.  GU examination is not concerning for PID.  Wet prep shows only WBCs, no evidence of yeast or BV. Low suspicion of obstruction, perforation, ovarian torsion, ectopic pregnancy, TOA, or appendicitis.  Low suspicion of acute surgical abdominal processes in the absence of pain or fever. Patient stable for discharge home with follow-up with her primary care physician or OB/GYN for reevaluation.  Discussed indications for return to the ED. Pt verbalized understanding of and agreement with plan and is safe for discharge home at this time.  Final Clinical Impressions(s) / ED Diagnoses   Final diagnoses:  Vaginal discharge  Urinary frequency  Viral conjunctivitis of both eyes    ED Discharge Orders        Ordered    naphazoline-pheniramine (NAPHCON-A) 0.025-0.3 % ophthalmic solution  4 times daily PRN     08/14/17 1834      Jeanie SewerFawze, Kaye Luoma A, PA-C 08/14/17 2220  Arby BarrettePfeiffer, Marcy, MD 08/23/17 1444

## 2017-08-14 NOTE — ED Notes (Signed)
CBG 98 reported to Huntsman CorporationBrooke Hanna RN

## 2017-08-14 NOTE — ED Triage Notes (Signed)
Pt to ED c/o polyuria and polydipsia x 2 months and vaginal d/c x 1 month. Patient states she's had a recent weight gain and her BP is high when she's over 300lb. Patient also reports diabetes runs in her family. Denies other urinary symptoms, no fevers.

## 2017-08-15 LAB — RPR: RPR Ser Ql: NONREACTIVE

## 2017-08-15 LAB — HIV ANTIBODY (ROUTINE TESTING W REFLEX): HIV Screen 4th Generation wRfx: NONREACTIVE

## 2017-08-15 LAB — GC/CHLAMYDIA PROBE AMP (~~LOC~~) NOT AT ARMC
Chlamydia: NEGATIVE
Neisseria Gonorrhea: NEGATIVE

## 2017-09-16 ENCOUNTER — Emergency Department (HOSPITAL_COMMUNITY): Payer: No Typology Code available for payment source

## 2017-09-16 ENCOUNTER — Encounter (HOSPITAL_COMMUNITY): Payer: Self-pay | Admitting: Emergency Medicine

## 2017-09-16 ENCOUNTER — Other Ambulatory Visit: Payer: Self-pay

## 2017-09-16 ENCOUNTER — Emergency Department (HOSPITAL_COMMUNITY)
Admission: EM | Admit: 2017-09-16 | Discharge: 2017-09-16 | Disposition: A | Discharge status: Home / Self Care | Payer: No Typology Code available for payment source | Attending: Emergency Medicine | Admitting: Emergency Medicine

## 2017-09-16 DIAGNOSIS — Y999 Unspecified external cause status: Secondary | ICD-10-CM | POA: Insufficient documentation

## 2017-09-16 DIAGNOSIS — R0789 Other chest pain: Secondary | ICD-10-CM | POA: Insufficient documentation

## 2017-09-16 DIAGNOSIS — S161XXA Strain of muscle, fascia and tendon at neck level, initial encounter: Secondary | ICD-10-CM | POA: Insufficient documentation

## 2017-09-16 DIAGNOSIS — Y9389 Activity, other specified: Secondary | ICD-10-CM | POA: Insufficient documentation

## 2017-09-16 DIAGNOSIS — I10 Essential (primary) hypertension: Secondary | ICD-10-CM | POA: Insufficient documentation

## 2017-09-16 DIAGNOSIS — Y92410 Unspecified street and highway as the place of occurrence of the external cause: Secondary | ICD-10-CM | POA: Diagnosis not present

## 2017-09-16 DIAGNOSIS — Z79899 Other long term (current) drug therapy: Secondary | ICD-10-CM | POA: Diagnosis not present

## 2017-09-16 DIAGNOSIS — S199XXA Unspecified injury of neck, initial encounter: Secondary | ICD-10-CM | POA: Diagnosis present

## 2017-09-16 LAB — POC URINE PREG, ED: Preg Test, Ur: NEGATIVE

## 2017-09-16 MED ORDER — METHOCARBAMOL 500 MG PO TABS: 500.0000 mg | ORAL_TABLET | Freq: Two times a day (BID) | ORAL | 0 refills | Status: DC

## 2017-09-16 MED ORDER — METHOCARBAMOL 500 MG PO TABS
500.0000 mg | ORAL_TABLET | Freq: Once | ORAL | Status: AC
  Administered 2017-09-16: 500 mg via ORAL
  Filled 2017-09-16: qty 1

## 2017-09-16 MED ORDER — NAPROXEN 500 MG PO TABS
500.0000 mg | ORAL_TABLET | Freq: Once | ORAL | Status: AC
  Administered 2017-09-16: 500 mg via ORAL
  Filled 2017-09-16: qty 1

## 2017-09-16 MED ORDER — NAPROXEN 375 MG PO TABS: 375.0000 mg | ORAL_TABLET | Freq: Two times a day (BID) | ORAL | 0 refills | Status: DC

## 2017-09-16 NOTE — Discharge Instructions (Signed)
Your imaging was very reassuring in the ED today.  Your symptoms are likely musculoskeletal in nature.  Please take the Naproxen as prescribed for pain. Do not take any additional NSAIDs including Motrin, Aleve, Ibuprofen, Advil. Haven been given your first dose in the ED today.  Please take the robaxin for muscle relaxation. This medication will make you drowsy so avoid situation that could place you in danger.   Warm compresses to the affected area.  Warm soaks and Epsom salt will also help with your pain.  Return to the ED or follow-up with your primary care doctor if symptoms not improving.  Return to ED with any worsening symptoms.
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
## 2017-09-16 NOTE — ED Notes (Signed)
Bed: [facility] Expected date:  Expected time:  Means of arrival:  Comments: 
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
## 2018-09-12 ENCOUNTER — Other Ambulatory Visit: Payer: Self-pay

## 2018-09-12 ENCOUNTER — Encounter (HOSPITAL_COMMUNITY): Payer: Self-pay

## 2018-09-12 ENCOUNTER — Emergency Department (HOSPITAL_COMMUNITY)
Admission: EM | Admit: 2018-09-12 | Discharge: 2018-09-12 | Disposition: A | Discharge status: Home / Self Care | Payer: Medicaid Other | Attending: Emergency Medicine | Admitting: Emergency Medicine

## 2018-09-12 DIAGNOSIS — B9689 Other specified bacterial agents as the cause of diseases classified elsewhere: Secondary | ICD-10-CM | POA: Insufficient documentation

## 2018-09-12 DIAGNOSIS — I10 Essential (primary) hypertension: Secondary | ICD-10-CM | POA: Insufficient documentation

## 2018-09-12 DIAGNOSIS — N898 Other specified noninflammatory disorders of vagina: Secondary | ICD-10-CM | POA: Insufficient documentation

## 2018-09-12 DIAGNOSIS — N760 Acute vaginitis: Secondary | ICD-10-CM | POA: Insufficient documentation

## 2018-09-12 LAB — URINALYSIS, ROUTINE W REFLEX MICROSCOPIC
BILIRUBIN URINE: NEGATIVE
GLUCOSE, UA: NEGATIVE mg/dL
HGB URINE DIPSTICK: NEGATIVE
Ketones, ur: 80 mg/dL — AB
LEUKOCYTES UA: NEGATIVE
NITRITE: NEGATIVE
PROTEIN: 30 mg/dL — AB
SPECIFIC GRAVITY, URINE: 1.028 (ref 1.005–1.030)
pH: 5 (ref 5.0–8.0)

## 2018-09-12 LAB — WET PREP, GENITAL
SPERM: NONE SEEN
Trich, Wet Prep: NONE SEEN
Yeast Wet Prep HPF POC: NONE SEEN

## 2018-09-12 LAB — PREGNANCY, URINE: Preg Test, Ur: NEGATIVE

## 2018-09-12 MED ORDER — CEFTRIAXONE SODIUM 250 MG IJ SOLR
250.0000 mg | Freq: Once | INTRAMUSCULAR | Status: AC
  Administered 2018-09-12: 250 mg via INTRAMUSCULAR
  Filled 2018-09-12: qty 250

## 2018-09-12 MED ORDER — METRONIDAZOLE 0.75 % VA GEL: 1.0000 | Freq: Two times a day (BID) | VAGINAL | 0 refills | Status: AC

## 2018-09-12 MED ORDER — AZITHROMYCIN 250 MG PO TABS
1000.0000 mg | ORAL_TABLET | Freq: Once | ORAL | Status: AC
  Administered 2018-09-12: 1000 mg via ORAL
  Filled 2018-09-12: qty 4

## 2018-09-12 NOTE — ED Triage Notes (Signed)
Pt thinks she has a UTI, states her urine has an odor & denies dysuria or the presence of blood in her urine. Pt also states she has vaginal discharge that appears brown.

## 2018-09-12 NOTE — ED Provider Notes (Signed)
[facility] EMERGENCY DEPARTMENT Provider Note   CSN: [id number] Arrival date & time: 09/12/18  6387     History   Chief Complaint Chief Complaint  Patient presents with  . Urinary Tract Infection    HPI Allison Dennis is a 43 y.o. female presenting for evaluation of urinary frequency and vaginal discharge.  Patient states for the past month, she has been having symptoms.  She reports a brownish vaginal discharge.  She has associated urinary frequency.  She denies dysuria or hematuria.  Patient states that following an ablation 8 years ago, she only has intermittent spotting, does not have 2 periods.  She denies fevers, chills, nausea, vomiting, abdominal pain.  She reports mild intermittent cramping, not precipitated or relieved by anything.  She has not taken anything for her symptoms including Tylenol ibuprofen.  Patient states she had something similar last year, but does not remember what was going on at that time.  Patient states she is sexually active with one female partner who is symptom-free.  Patient states she takes no medications daily.  Per chart review, patient with a history of anemia and hypertension, patient states she is not currently taking medicine for those.  HPI  Past Medical History:  Diagnosis Date  . Anemia    history  . Hypertension   . SVD (spontaneous vaginal delivery)    x 3 - 1 set of twins    Patient Active Problem List   Diagnosis Date Noted  . Candidiasis of vulva and vagina 08/13/2013  . Excessive or frequent menstruation 12/31/2012  . Urinary tract infection, site not specified 12/31/2012    Past Surgical History:  Procedure Laterality Date  . CESAREAN SECTION     x 1  . DILITATION & CURRETTAGE/HYSTROSCOPY WITH HYDROTHERMAL ABLATION N/A 12/18/2013   Procedure: DILATATION & CURETTAGE/HYSTEROSCOPY WITH HYDROTHERMAL ABLATION;  Surgeon: Brock Bad, MD;  Location: [facility];  Service: Gynecology;  Laterality: N/A;  .  TONSILLECTOMY    . TUBAL LIGATION Bilateral   . WISDOM TOOTH EXTRACTION       OB History    Gravida  5   Para  4   Term  3   Preterm  1   AB  1   Living  5     SAB  1   TAB  0   Ectopic  0   Multiple  1   Live Births  5        Obstetric Comments  Pt had twins with this pregnancy.         Home Medications    Prior to Admission medications   Medication Sig Start Date End Date Taking? Authorizing Provider  clotrimazole (LOTRIMIN) 1 % cream Apply 1 application topically 2 (two) times daily. Patient not taking: Reported on 11/08/2015 01/25/14   Brock Bad, MD  doxycycline (VIBRAMYCIN) 100 MG capsule Take 1 capsule (100 mg total) by mouth 2 (two) times daily. Patient not taking: Reported on 11/08/2015 01/25/14   Brock Bad, MD  ibuprofen (ADVIL,MOTRIN) 800 MG tablet Take 1 tablet (800 mg total) by mouth every 8 (eight) hours as needed. Patient not taking: Reported on 11/08/2015 12/18/13   Brock Bad, MD  lisinopril (PRINIVIL,ZESTRIL) 20 MG tablet TAKE 1 TABLET BY MOUTH EVERY DAY FOR BLOOD PRESSURE Patient not taking: Reported on 11/08/2015 03/26/13   Brock Bad, MD  meloxicam (MOBIC) 15 MG tablet Take 1 tablet (15 mg total) by mouth daily. Patient not  taking: Reported on 11/08/2015 03/18/14   Emilia BeckSzekalski, Kaitlyn, PA-C  methocarbamol (ROBAXIN) 500 MG tablet Take 1 tablet (500 mg total) by mouth 2 (two) times daily. 09/16/17   Rise MuLeaphart, Kenneth T, PA-C  metroNIDAZOLE (FLAGYL) 500 MG tablet Take 1 po bid x 7 days q 6 weeks x 2, starting 03-08-14. Patient not taking: Reported on 11/08/2015 03/08/14   Brock BadHarper, Charles A, MD  metroNIDAZOLE (FLAGYL) 500 MG tablet Take 1 tablet (500 mg total) by mouth 2 (two) times daily. Patient not taking: Reported on 11/08/2015 01/25/14   Brock BadHarper, Charles A, MD  metroNIDAZOLE (METROGEL VAGINAL) 0.75 % vaginal gel Place 1 Applicatorful vaginally 2 (two) times daily for 5 days. 09/12/18 09/17/18  Anquanette Bahner, PA-C    naphazoline-pheniramine (NAPHCON-A) 0.025-0.3 % ophthalmic solution Place 1 drop into both eyes 4 (four) times daily as needed for eye irritation. 08/14/17   Fawze, Mina A, PA-C  naproxen (NAPROSYN) 375 MG tablet Take 1 tablet (375 mg total) by mouth 2 (two) times daily. 09/16/17   Rise MuLeaphart, Kenneth T, PA-C  tobramycin (TOBREX) 0.3 % ophthalmic solution Place 2 drops into both eyes every 4 (four) hours. 09/25/16   Elson AreasSofia, Leslie K, PA-C    Family History History reviewed. No pertinent family history.  Social History Social History   Tobacco Use  . Smoking status: Never Smoker  . Smokeless tobacco: Never Used  Substance Use Topics  . Alcohol use: Yes    Comment: Socially   . Drug use: No     Allergies   Patient has no known allergies.   Review of Systems Review of Systems  Genitourinary: Positive for frequency and vaginal discharge.  All other systems reviewed and are negative.    Physical Exam Updated Vital Signs BP (!) 157/100 (BP Location: Right Arm)   Pulse 80   Temp 98.9 F (37.2 C) (Oral)   Ht 5\' 5"  (1.651 m)   Wt 130.6 kg   SpO2 100%   BMI 47.93 kg/m   Physical Exam Vitals signs and nursing note reviewed. Exam conducted with a chaperone present.  Constitutional:      General: She is not in acute distress.    Appearance: She is well-developed. She is obese.     Comments: Obese female resting comfortably in the chair no acute distress  HENT:     Head: Normocephalic and atraumatic.  Eyes:     Conjunctiva/sclera: Conjunctivae normal.     Pupils: Pupils are equal, round, and reactive to light.  Neck:     Musculoskeletal: Normal range of motion and neck supple.  Cardiovascular:     Rate and Rhythm: Normal rate and regular rhythm.     Pulses: Normal pulses.  Pulmonary:     Effort: Pulmonary effort is normal. No respiratory distress.     Breath sounds: Normal breath sounds. No wheezing.  Abdominal:     General: There is no distension.     Palpations:  Abdomen is soft. There is no mass.     Tenderness: There is no abdominal tenderness. There is no guarding or rebound.     Comments: No tenderness palpation the abdomen.  Soft wihtout rigidity, guarding, distention.  Negative rebound.  Negative CVA tenderness.  Genitourinary:    Exam position: Supine.     Vagina: Normal.     Cervix: Discharge present. No cervical motion tenderness, friability or erythema.     Uterus: Normal. Not tender.      Adnexa: Right adnexa normal and left adnexa normal.  Right: No tenderness.         Left: No tenderness.       Comments: Thin white discharge noted on exam.  No CMT or adnexal tenderness. Musculoskeletal: Normal range of motion.  Skin:    General: Skin is warm and dry.     Capillary Refill: Capillary refill takes less than 2 seconds.  Neurological:     Mental Status: She is alert and oriented to person, place, and time.      ED Treatments / Results  Labs (all labs ordered are listed, but only abnormal results are displayed) Labs Reviewed  WET PREP, GENITAL - Abnormal; Notable for the following components:      Result Value   Clue Cells Wet Prep HPF POC PRESENT (*)    WBC, Wet Prep HPF POC MANY (*)    All other components within normal limits  URINALYSIS, ROUTINE W REFLEX MICROSCOPIC - Abnormal; Notable for the following components:   APPearance HAZY (*)    Ketones, ur 80 (*)    Protein, ur 30 (*)    Bacteria, UA RARE (*)    All other components within normal limits  PREGNANCY, URINE  RPR  HIV ANTIBODY (ROUTINE TESTING W REFLEX)  POC URINE PREG, ED  GC/CHLAMYDIA PROBE AMP ([place]) NOT AT [facility]    EKG None  Radiology No results found.  Procedures Procedures (including critical care time)  Medications Ordered in ED Medications  cefTRIAXone (ROCEPHIN) injection 250 mg (has no administration in time range)  azithromycin (ZITHROMAX) tablet 1,000 mg (has no administration in time range)     Initial Impression /  Assessment and Plan / ED Course  I have reviewed the triage vital signs and the nursing notes.  Pertinent labs & imaging results that were available during my care of the patient were reviewed by me and considered in my medical decision making (see chart for details).     Patient presenting for evaluation of 1 month history of vaginal discharge and urinary frequency.  Physical exam reassuring, she is afebrile not tachycardic.  Appears nontoxic.  Abdominal exam is reassuring.  Will order urine to rule out UTI, urine prior, and pelvic exam for further evaluation. Pt is mildly hypertensive, is not taking medication for her HTN.   Physical exam reassuring, no indication of PID, cervicitis, TOA, torsion. UA negative for UTI.  Wet prep positive for clue cells and many PVCs.  Pregnancy negative.  Gonorrhea, chlamydia, HIV, and syphilis sent.  Discussed results with patient.  Patient elects for prophylactic treatment for gonorrhea and chlamydia.  Will treat BV with MetroGel.  To inform partner for any positive results in the next several days.  At this time, patient appears safe for discharge.  Return precautions given.  Patient states she understands agrees plan.  Final Clinical Impressions(s) / ED Diagnoses   Final diagnoses:  BV (bacterial vaginosis)  Vaginal discharge    ED Discharge Orders         Ordered    metroNIDAZOLE (METROGEL VAGINAL) 0.75 % vaginal gel  2 times daily     09/12/18 1153           Kree Armato, PA-C 09/12/18 1158    Arby Barrette, MD 09/15/18 1043

## 2018-09-12 NOTE — Discharge Instructions (Addendum)
Your test today was positive for bacterial vaginosis.  Use the cream twice a day for the next 5 days as prescribed. You were treated today for gonorrhea and chlamydia.  Results have not returned.  If positive, you will receive a phone call, but you do not need further treatment.  You will need to let your sexual partner know so they can be treated. We also tested for HIV and syphilis.  If positive, you will need to follow-up with the health department and let your partner know. If results are negative, you will not receive a phone call.  Either way, you may check online on MyChart. Use Tylenol or ibuprofen as needed for cramping.  Follow-up with health department as needed.   Return to the emergency room with any new, worsening or concerning symptoms.

## 2018-09-13 LAB — GC/CHLAMYDIA PROBE AMP (~~LOC~~) NOT AT ARMC
Chlamydia: NEGATIVE
Neisseria Gonorrhea: NEGATIVE

## 2018-09-13 LAB — RPR: RPR Ser Ql: NONREACTIVE

## 2018-09-13 LAB — HIV ANTIBODY (ROUTINE TESTING W REFLEX): HIV SCREEN 4TH GENERATION: NONREACTIVE

## 2018-12-16 ENCOUNTER — Other Ambulatory Visit: Payer: Self-pay

## 2018-12-16 ENCOUNTER — Emergency Department (HOSPITAL_COMMUNITY): Payer: Self-pay

## 2018-12-16 ENCOUNTER — Emergency Department (HOSPITAL_COMMUNITY)
Admission: EM | Admit: 2018-12-16 | Discharge: 2018-12-16 | Disposition: A | Discharge status: Home / Self Care | Payer: Self-pay | Attending: Emergency Medicine | Admitting: Emergency Medicine

## 2018-12-16 DIAGNOSIS — N3000 Acute cystitis without hematuria: Secondary | ICD-10-CM | POA: Insufficient documentation

## 2018-12-16 DIAGNOSIS — I10 Essential (primary) hypertension: Secondary | ICD-10-CM | POA: Insufficient documentation

## 2018-12-16 DIAGNOSIS — R1084 Generalized abdominal pain: Secondary | ICD-10-CM

## 2018-12-16 LAB — WET PREP, GENITAL
Clue Cells Wet Prep HPF POC: NONE SEEN
Sperm: NONE SEEN
Trich, Wet Prep: NONE SEEN
Yeast Wet Prep HPF POC: NONE SEEN

## 2018-12-16 LAB — CBC WITH DIFFERENTIAL/PLATELET
Abs Immature Granulocytes: 0.01 10*3/uL (ref 0.00–0.07)
Basophils Absolute: 0 10*3/uL (ref 0.0–0.1)
Basophils Relative: 1 %
Eosinophils Absolute: 0.1 10*3/uL (ref 0.0–0.5)
Eosinophils Relative: 1 %
HCT: 45 % (ref 36.0–46.0)
Hemoglobin: 15 g/dL (ref 12.0–15.0)
Immature Granulocytes: 0 %
Lymphocytes Relative: 52 %
Lymphs Abs: 2.8 10*3/uL (ref 0.7–4.0)
MCH: 29.4 pg (ref 26.0–34.0)
MCHC: 33.3 g/dL (ref 30.0–36.0)
MCV: 88.1 fL (ref 80.0–100.0)
Monocytes Absolute: 0.4 10*3/uL (ref 0.1–1.0)
Monocytes Relative: 7 %
Neutro Abs: 2.1 10*3/uL (ref 1.7–7.7)
Neutrophils Relative %: 39 %
Platelets: 285 10*3/uL (ref 150–400)
RBC: 5.11 MIL/uL (ref 3.87–5.11)
RDW: 13.2 % (ref 11.5–15.5)
WBC: 5.4 10*3/uL (ref 4.0–10.5)
nRBC: 0 % (ref 0.0–0.2)

## 2018-12-16 LAB — URINALYSIS, ROUTINE W REFLEX MICROSCOPIC
Bilirubin Urine: NEGATIVE
Glucose, UA: NEGATIVE mg/dL
Ketones, ur: NEGATIVE mg/dL
Nitrite: NEGATIVE
Protein, ur: NEGATIVE mg/dL
Specific Gravity, Urine: 1.01 (ref 1.005–1.030)
pH: 6 (ref 5.0–8.0)

## 2018-12-16 LAB — I-STAT CHEM 8, ED
BUN: 7 mg/dL (ref 6–20)
Calcium, Ion: 1.18 mmol/L (ref 1.15–1.40)
Chloride: 104 mmol/L (ref 98–111)
Creatinine, Ser: 0.8 mg/dL (ref 0.44–1.00)
Glucose, Bld: 105 mg/dL — ABNORMAL HIGH (ref 70–99)
HCT: 45 % (ref 36.0–46.0)
Hemoglobin: 15.3 g/dL — ABNORMAL HIGH (ref 12.0–15.0)
Potassium: 4 mmol/L (ref 3.5–5.1)
Sodium: 140 mmol/L (ref 135–145)
TCO2: 28 mmol/L (ref 22–32)

## 2018-12-16 LAB — URINALYSIS, MICROSCOPIC (REFLEX)

## 2018-12-16 LAB — PREGNANCY, URINE: Preg Test, Ur: NEGATIVE

## 2018-12-16 LAB — LIPASE, BLOOD: Lipase: 27 U/L (ref 11–51)

## 2018-12-16 MED ORDER — ONDANSETRON 4 MG PO TBDP
4.0000 mg | ORAL_TABLET | Freq: Once | ORAL | Status: AC
  Administered 2018-12-16: 4 mg via ORAL
  Filled 2018-12-16: qty 1

## 2018-12-16 MED ORDER — CEPHALEXIN 500 MG PO CAPS: 500.0000 mg | ORAL_CAPSULE | Freq: Two times a day (BID) | ORAL | 0 refills | Status: AC

## 2018-12-16 MED ORDER — KETOROLAC TROMETHAMINE 30 MG/ML IJ SOLN
30.0000 mg | Freq: Once | INTRAMUSCULAR | Status: AC
  Administered 2018-12-16: 30 mg via INTRAVENOUS
  Filled 2018-12-16: qty 1

## 2018-12-16 NOTE — ED Triage Notes (Addendum)
Pt endorses LLQ abdominal pain x 1 week and a few episodes of urinary incontinence yesterday. Denies pain or burning with urination.

## 2018-12-16 NOTE — Discharge Instructions (Addendum)
You have been seen today for abdominal pain. Please read and follow all provided instructions. Return to the emergency room for worsening condition or new concerning symptoms.    Her blood pressure was elevated while you are in the emergency department today.  We recommend you follow-up with your PCP within 1 week to have blood pressure rechecked.    1. Medications:  Prescription sent to your pharmacy for Keflex. This is an an antibiotic for urinary tract infections.  Please take as prescribed. Can take Tylenol and ibuprofen for pain as needed.  Please take as directed. Continue usual home medications Take medications as prescribed. Please review all of the medicines and only take them if you do not have an allergy to them.   2. Treatment: rest, drink plenty of fluids  3. Follow Up: Please follow up with your primary doctor in 2-5 days for discussion of your diagnoses and further evaluation after today's visit; Call today to arrange your follow up.  If you do not have a primary care doctor use the resource guide provided to find one;  -We have also included the contact information for [facility].  You can call this number to schedule a follow-up outpatient appointment if you do not have a primary care doctor.  It is also a possibility that you have an allergic reaction to any of the medicines that you have been prescribed - Everybody reacts differently to medications and while MOST people have no trouble with most medicines, you may have a reaction such as nausea, vomiting, rash, swelling, shortness of breath. If this is the case, please stop taking the medicine immediately and contact your physician.  ?

## 2018-12-16 NOTE — ED Provider Notes (Addendum)
[facility] EMERGENCY DEPARTMENT Provider Note   CSN: [id number] Arrival date & time: 12/16/18  0747    History   Chief Complaint Chief Complaint  Patient presents with   Urinary Incontinence   Abdominal Pain    HPI Allison Dennis is a 43 y.o. female with a history of anemia, hypertension presenting to the emergency department today with chief complaint of left lower quadrant abdominal pain x 1 week. She describes the pain as intermittent cramping. It does not radiate. She rates is 8 out of 10 in severity. She has not taken anything for pain prior to arrival. She admits to 3 episodes of urinary incontinence.  She states she felt like she had to go the bathroom but did not make it in time.  This is unusual for her.  Also reports associated nausea without emesis.   Abdominal surgical history includes Cesarean section x 8 years ago. She also had an ablation x8 years ago, but has continued to have spotting over the years. She reports to spotting in the last month. She is sexually active with one female partner without protection. She is not concerned for STIs. She denies fever, chills, hematuria, vaginal discharge, pelvic pain, dysuria, back pain, flank pain, diarrhea, bloody stool. Also denies fevers, weight loss, numbness/weakness of upper and lower extremities, bowel incontinence, urinary retention, history of cancer, saddle anesthesia, history of back surgery, history of IVDA.  History provided by patient with additional history obtained from chart review.    Past Medical History:  Diagnosis Date   Anemia    history   Hypertension    SVD (spontaneous vaginal delivery)    x 3 - 1 set of twins    Patient Active Problem List   Diagnosis Date Noted   Candidiasis of vulva and vagina 08/13/2013   Excessive or frequent menstruation 12/31/2012   Urinary tract infection, site not specified 12/31/2012    Past Surgical History:  Procedure Laterality Date    CESAREAN SECTION     x 1   DILITATION & CURRETTAGE/HYSTROSCOPY WITH HYDROTHERMAL ABLATION N/A 12/18/2013   Procedure: DILATATION & CURETTAGE/HYSTEROSCOPY WITH HYDROTHERMAL ABLATION;  Surgeon: Brock Bad, MD;  Location: [facility];  Service: Gynecology;  Laterality: N/A;   TONSILLECTOMY     TUBAL LIGATION Bilateral    WISDOM TOOTH EXTRACTION       OB History    Gravida  5   Para  4   Term  3   Preterm  1   AB  1   Living  5     SAB  1   TAB  0   Ectopic  0   Multiple  1   Live Births  5        Obstetric Comments  Pt had twins with this pregnancy.         Home Medications    Prior to Admission medications   Medication Sig Start Date End Date Taking? Authorizing Provider  cephALEXin (KEFLEX) 500 MG capsule Take 1 capsule (500 mg total) by mouth 2 (two) times daily for 7 days. 12/16/18 12/23/18  Porschia Willbanks E, PA-C  clotrimazole (LOTRIMIN) 1 % cream Apply 1 application topically 2 (two) times daily. Patient not taking: Reported on 11/08/2015 01/25/14   Brock Bad, MD  ibuprofen (ADVIL,MOTRIN) 800 MG tablet Take 1 tablet (800 mg total) by mouth every 8 (eight) hours as needed. Patient not taking: Reported on 11/08/2015 12/18/13   Brock Bad, MD  lisinopril (PRINIVIL,ZESTRIL) 20 MG tablet TAKE 1 TABLET BY MOUTH EVERY DAY FOR BLOOD PRESSURE Patient not taking: Reported on 11/08/2015 03/26/13   Brock Bad, MD  meloxicam (MOBIC) 15 MG tablet Take 1 tablet (15 mg total) by mouth daily. Patient not taking: Reported on 11/08/2015 03/18/14   Emilia Beck, PA-C  methocarbamol (ROBAXIN) 500 MG tablet Take 1 tablet (500 mg total) by mouth 2 (two) times daily. Patient not taking: Reported on 12/16/2018 09/16/17   Demetrios Loll T, PA-C  naphazoline-pheniramine (NAPHCON-A) 0.025-0.3 % ophthalmic solution Place 1 drop into both eyes 4 (four) times daily as needed for eye irritation. Patient not taking: Reported on 12/16/2018 08/14/17   Michela Pitcher A, PA-C    naproxen (NAPROSYN) 375 MG tablet Take 1 tablet (375 mg total) by mouth 2 (two) times daily. Patient not taking: Reported on 12/16/2018 09/16/17   Demetrios Loll T, PA-C  tobramycin (TOBREX) 0.3 % ophthalmic solution Place 2 drops into both eyes every 4 (four) hours. Patient not taking: Reported on 12/16/2018 09/25/16   Osie Cheeks    Family History No family history on file.  Social History Social History   Tobacco Use   Smoking status: Never Smoker   Smokeless tobacco: Never Used  Substance Use Topics   Alcohol use: Yes    Comment: Socially    Drug use: No     Allergies   Patient has no known allergies.   Review of Systems Review of Systems  Constitutional: Negative for chills and fever.  HENT: Negative for congestion, ear discharge, ear pain, sinus pressure, sinus pain and sore throat.   Eyes: Negative for pain and redness.  Respiratory: Negative for cough and shortness of breath.   Cardiovascular: Negative for chest pain.  Gastrointestinal: Positive for abdominal pain and nausea. Negative for blood in stool, constipation, diarrhea and vomiting.  Genitourinary: Positive for frequency and urgency. Negative for dysuria, hematuria, pelvic pain, vaginal bleeding, vaginal discharge and vaginal pain.  Musculoskeletal: Negative for back pain and neck pain.  Skin: Negative for wound.  Allergic/Immunologic: Negative for immunocompromised state.  Neurological: Negative for weakness, numbness and headaches.     Physical Exam Updated Vital Signs BP 139/86    Pulse 69    Temp 97.9 F (36.6 C) (Oral)    Resp 16    Ht 5\' 5"  (1.651 m)    Wt (!) 140.6 kg    SpO2 99%    BMI 51.59 kg/m   Physical Exam Vitals signs and nursing note reviewed.  Constitutional:      Appearance: She is not ill-appearing or toxic-appearing.  HENT:     Head: Normocephalic and atraumatic.  Eyes:     General: No scleral icterus.    Conjunctiva/sclera: Conjunctivae normal.  Neck:      Musculoskeletal: Normal range of motion.  Cardiovascular:     Rate and Rhythm: Normal rate and regular rhythm.     Pulses: Normal pulses.          Radial pulses are 2+ on the right side and 2+ on the left side.     Heart sounds: Normal heart sounds.  Pulmonary:     Effort: Pulmonary effort is normal.     Breath sounds: Normal breath sounds.  Abdominal:     General: Bowel sounds are normal. There is no distension.     Palpations: Abdomen is soft.     Tenderness: There is abdominal tenderness in the left lower quadrant. There is no right CVA  tenderness, left CVA tenderness, guarding or rebound. Negative signs include Murphy's sign, Rovsing's sign and McBurney's sign.     Hernia: No hernia is present.     Comments: No peritoneal signs.  Genitourinary:    Comments: Normal external genitalia. No pain with speculum insertion. Closed cervical os with normal appearance - no rash or lesions. No significant discharge or bleeding noted from cervix or in vaginal vault. On bimanual examination no adnexal tenderness or cervical motion tenderness. Chaperone Sarah RN present during exam.  Musculoskeletal: Normal range of motion.     Comments: Full range of motion of the thoracic spine and lumbar spine with flexion, hyperextension, and lateral flexion. No midline tenderness or stepoffs. No tenderness to palpation of the spinous processes of the thoracic spine or lumbar spine. No tenderness to palpation of the paraspinous muscles of thoracic and lumbar spine   Skin:    General: Skin is warm and dry.  Neurological:     Mental Status: She is alert and oriented to person, place, and time.     Comments: Sensation grossly intact to light touch in the lower extremities bilaterally. No saddle anesthesias. Strength 5/5 with flexion and extension at the bilateral hips, knees, and ankles. No noted gait deficit. Coordination intact with heel to shin testing.   Psychiatric:        Behavior: Behavior normal.       ED Treatments / Results  Labs (all labs ordered are listed, but only abnormal results are displayed) Labs Reviewed  WET PREP, GENITAL - Abnormal; Notable for the following components:      Result Value   WBC, Wet Prep HPF POC MANY (*)    All other components within normal limits  URINALYSIS, ROUTINE W REFLEX MICROSCOPIC - Abnormal; Notable for the following components:   Hgb urine dipstick MODERATE (*)    Leukocytes,Ua SMALL (*)    All other components within normal limits  URINALYSIS, MICROSCOPIC (REFLEX) - Abnormal; Notable for the following components:   Bacteria, UA RARE (*)    All other components within normal limits  I-STAT CHEM 8, ED - Abnormal; Notable for the following components:   Glucose, Bld 105 (*)    Hemoglobin 15.3 (*)    All other components within normal limits  URINE CULTURE  CBC WITH DIFFERENTIAL/PLATELET  PREGNANCY, URINE  LIPASE, BLOOD  GC/CHLAMYDIA PROBE AMP ([place]) NOT [facility]    EKG None  Radiology Ct Abdomen Pelvis Wo Contrast  Result Date: 12/16/2018 CLINICAL DATA:  Abdominal pain EXAM: CT ABDOMEN AND PELVIS WITHOUT CONTRAST TECHNIQUE: Multidetector CT imaging of the abdomen and pelvis was performed following the standard protocol without oral or IV contrast. COMPARISON:  None. FINDINGS: Lower chest: Lung bases are clear. Hepatobiliary: No focal liver lesions are evident on this noncontrast enhanced study. The gallbladder wall is not appreciably thickened. There is no biliary duct dilatation. Pancreas: No pancreatic mass or inflammatory focus. Spleen: No splenic lesions are evident. Adrenals/Urinary Tract: Adrenals bilaterally appear unremarkable. Kidneys bilaterally show no evident mass or hydronephrosis on either side. There is no appreciable renal or ureteral calculus on either side. Urinary bladder is midline with wall thickness within normal limits. Several pelvic phleboliths are near but felt to be separate from the distal ureters.  Stomach/Bowel: There is no appreciable bowel wall or mesenteric thickening. There is no evident bowel obstruction. Terminal ileum appears normal. There is no evident free air or portal venous air. Vascular/Lymphatic: No abdominal aortic aneurysm. No vascular lesions are evident  on this noncontrast enhanced study. There is no evident adenopathy in the abdomen or pelvis. Reproductive: Uterus is anteverted.  No evident pelvic mass. Other: The appendix appears normal. No evident abscess or ascites in the abdomen or pelvis. There is a rather minimal ventral hernia containing only fat. Musculoskeletal: There is degenerative change in the lower thoracic and lumbar regions. 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 There are no blastic or lytic bone lesions. No intramuscular lesions are evident. IMPRESSION: 1. Osteitis condensans ilia bilaterally, symmetric. This is a finding that potentially may result in abdominal pain. 2. No evident bowel wall thickening or bowel obstruction. No abscess in the abdomen or pelvis. Appendix appears normal. 3.  No evident renal or ureteral calculus.  No hydronephrosis. 4.  Rather minimal ventral hernia containing only fat. Electronically Signed   By: Bretta Bang III M.D.   On: 12/16/2018 15:27    Procedures Procedures (including critical care time)  Medications Ordered in ED Medications  ondansetron (ZOFRAN-ODT) disintegrating tablet 4 mg (4 mg Oral Given 12/16/18 0818)  ketorolac (TORADOL) 30 MG/ML injection 30 mg (30 mg Intravenous Given 12/16/18 0920)     Initial Impression / Assessment and Plan / ED Course  I have reviewed the triage vital signs and the nursing notes.  Pertinent labs & imaging results that were available during my care of the patient were reviewed by me and considered in my medical decision making (see chart for details).   43 year old female presents with 1 week of left lower quadrant pain and 3 episodes of urinary incontinence yesterday.  She  is afebrile, nontoxic-appearing.  On exam she has point tenderness in left lower quadrant. No neurological deficits and normal neuro exam.  No concern for cauda equina.  No fever, night sweats, weight loss, h/o cancer, IVDU. Will initiate work-up with CBC, CMP, UA, urine pregnant, lipase, pelvic exam, CT abdomen pelvis. Patient noted to be hypertensive in the emergency department.  No signs of hypertensive urgency. Patient given zofran and toradol for pain, will reassess. DDx includes UTI, STI, constipation, PID, ovarian cyst, unlikely ovarian torsion as sx have been going on x1 week. Pt given zofran and Toradol for pain and nausea. UA shows possible urinary tract infection with small leukocytes, WBC 11-20, and rare bacteria, will send for culture.  Lab work otherwise unremarkable.  No leukocytosis, metabolic derangements, renal insufficiency.  Wet prep shows WBCs, otherwise no significant findings.  Urine pregnancy is negative.  On reassessment patient's abdomen is benign and nausea resolved. Pt is tolerating PO intake with ginger ale and crackers. CT abdomen pelvis viewed by me shows osteitis condensans ilia bilaterally symmetric and is otherwise normal. Pt discharged home with prescription for keflex for UTI.   Patient is hemodynamically stable, in NAD, and able to ambulate in the ED. Evaluation does not show pathology that would require ongoing emergent intervention or inpatient treatment. I explained the diagnosis to the patient.  Pain has been managed and has no complaints prior to discharge. Patient is comfortable with above plan and is stable for discharge at this time. All questions were answered prior to disposition. Strict return precautions for returning to the ED were discussed. Discussed with patient the need for close follow-up for blood pressure and abdominal pain management by her primary care physician. Pt case discussed with Dr. Rubin Payor who agrees with my plan.    Vitals:   12/16/18 0754  12/16/18 1212 12/16/18 1517 12/16/18 1543  BP:  (!) 155/101 (!) 150/87 139/86  Pulse:  69 68 69  Resp:  Temp:      TempSrc:      SpO2:  97% 99% 99%  Weight: (!) 140.6 kg     Height:  (1.651 m)       This note was prepared with assistance of Dragon voice recognition software. Occasional wrong-word or sound-a-like substitutions may have occurred due to the inherent limitations of voice recognition software.  Final Clinical Impressions(s) / ED Diagnoses   Final diagnoses:  Acute cystitis without hematuria  Generalized abdominal pain    ED Discharge Orders         Ordered    cephALEXin (KEFLEX) 500 MG capsule  2 times daily     12/16/18 1508           Kaipo Ardis, [place], PA-C 12/16/18 2231    Jerrod Damiano, Mliss Sax 12/16/18 2233    Benjiman Core, MD 12/17/18 [phone]

## 2018-12-17 LAB — GC/CHLAMYDIA PROBE AMP (~~LOC~~) NOT AT ARMC
Chlamydia: NEGATIVE
Neisseria Gonorrhea: NEGATIVE

## 2018-12-18 LAB — URINE CULTURE: Culture: >=100000 — AB

## 2018-12-19 ENCOUNTER — Telehealth: Payer: Self-pay

## 2018-12-19 NOTE — Telephone Encounter (Signed)
Post ED Visit - Positive Culture Follow-up  Culture report reviewed by antimicrobial stewardship pharmacist: [facility] []  Enzo Bi, Pharm.D. []  Celedonio Miyamoto, Pharm.D., BCPS AQ-ID []  Garvin Fila, Pharm.D., BCPS []  Georgina Pillion, Pharm.D., BCPS []  [place], [place].D., BCPS, AAHIVP []  Estella Husk, Pharm.D., BCPS, AAHIVP []  Lysle Pearl, PharmD, BCPS []  Phillips Climes, PharmD, BCPS []  Agapito Games, PharmD, BCPS []  Verlan Friends, PharmD []  Mervyn Gay, PharmD, BCPS []  Vinnie Level, PharmD R fanning [facility] []  Len Childs, PharmD []  Greer Pickerel, PharmD []  Adalberto Cole, PharmD []  Perlie Gold, Rph []  Lonell Face) Jean Rosenthal, PharmD []  Earl Many, PharmD []  Junita Push, PharmD []  Dorna Leitz, PharmD []  Terrilee Files, PharmD []  Lynann Beaver, PharmD []  Keturah Barre, PharmD []  Loralee Pacas, PharmD []  Bernadene Person, PharmD   Positive urine culture Treated with Cephalexin, organism sensitive to the same and no further patient follow-up is required at this time.  Jerry Caras 12/19/2018, 8:46 AM

## 2020-01-08 ENCOUNTER — Encounter (HOSPITAL_COMMUNITY): Payer: Self-pay | Admitting: Emergency Medicine

## 2020-01-08 ENCOUNTER — Other Ambulatory Visit: Payer: Self-pay

## 2020-01-08 ENCOUNTER — Emergency Department (HOSPITAL_COMMUNITY)
Admission: EM | Admit: 2020-01-08 | Discharge: 2020-01-09 | Discharge status: Against Medical Advice | Payer: Medicaid Other | Attending: Emergency Medicine | Admitting: Emergency Medicine

## 2020-01-08 DIAGNOSIS — Z5321 Procedure and treatment not carried out due to patient leaving prior to being seen by health care provider: Secondary | ICD-10-CM | POA: Insufficient documentation

## 2020-01-08 DIAGNOSIS — N899 Noninflammatory disorder of vagina, unspecified: Secondary | ICD-10-CM | POA: Insufficient documentation

## 2020-01-08 LAB — URINALYSIS, ROUTINE W REFLEX MICROSCOPIC
Bilirubin Urine: NEGATIVE
Glucose, UA: NEGATIVE mg/dL
Hgb urine dipstick: NEGATIVE
Ketones, ur: NEGATIVE mg/dL
Leukocytes,Ua: NEGATIVE
Nitrite: NEGATIVE
Protein, ur: NEGATIVE mg/dL
Specific Gravity, Urine: 1.024 (ref 1.005–1.030)
pH: 5 (ref 5.0–8.0)

## 2020-01-08 NOTE — ED Triage Notes (Signed)
Pt c/o vaginal discharge for the past 2 weeks.

## 2020-01-09 ENCOUNTER — Other Ambulatory Visit: Payer: Self-pay

## 2020-01-09 ENCOUNTER — Encounter (HOSPITAL_COMMUNITY): Payer: Self-pay | Admitting: Emergency Medicine

## 2020-01-09 ENCOUNTER — Emergency Department (HOSPITAL_COMMUNITY)
Admission: EM | Admit: 2020-01-09 | Discharge: 2020-01-09 | Disposition: A | Discharge status: Home / Self Care | Payer: Medicaid Other | Attending: Emergency Medicine | Admitting: Emergency Medicine

## 2020-01-09 DIAGNOSIS — B9689 Other specified bacterial agents as the cause of diseases classified elsewhere: Secondary | ICD-10-CM | POA: Insufficient documentation

## 2020-01-09 DIAGNOSIS — E669 Obesity, unspecified: Secondary | ICD-10-CM | POA: Insufficient documentation

## 2020-01-09 DIAGNOSIS — R110 Nausea: Secondary | ICD-10-CM | POA: Insufficient documentation

## 2020-01-09 DIAGNOSIS — N760 Acute vaginitis: Secondary | ICD-10-CM | POA: Insufficient documentation

## 2020-01-09 DIAGNOSIS — I10 Essential (primary) hypertension: Secondary | ICD-10-CM | POA: Insufficient documentation

## 2020-01-09 DIAGNOSIS — Z6841 Body Mass Index (BMI) 40.0 and over, adult: Secondary | ICD-10-CM | POA: Insufficient documentation

## 2020-01-09 DIAGNOSIS — N898 Other specified noninflammatory disorders of vagina: Secondary | ICD-10-CM

## 2020-01-09 LAB — URINALYSIS, ROUTINE W REFLEX MICROSCOPIC
Bilirubin Urine: NEGATIVE
Glucose, UA: NEGATIVE mg/dL
Hgb urine dipstick: NEGATIVE
Ketones, ur: NEGATIVE mg/dL
Nitrite: NEGATIVE
Protein, ur: 30 mg/dL — AB
Specific Gravity, Urine: 1.029 (ref 1.005–1.030)
pH: 5 (ref 5.0–8.0)

## 2020-01-09 LAB — WET PREP, GENITAL
Sperm: NONE SEEN
Trich, Wet Prep: NONE SEEN
Yeast Wet Prep HPF POC: NONE SEEN

## 2020-01-09 LAB — RAPID HIV SCREEN (HIV 1/2 AB+AG)
HIV 1/2 Antibodies: NONREACTIVE
HIV-1 P24 Antigen - HIV24: NONREACTIVE

## 2020-01-09 MED ORDER — METRONIDAZOLE 500 MG PO TABS: 500.0000 mg | ORAL_TABLET | Freq: Two times a day (BID) | ORAL | 0 refills | Status: AC

## 2020-01-09 NOTE — ED Provider Notes (Signed)
[facility] EMERGENCY DEPARTMENT Provider Note   CSN: [id number] Arrival date & time: 01/09/20  1634     History Chief Complaint  Patient presents with   Vaginal Discharge    Allison Dennis is a 44 y.o. female.  44 y.o female with a PMH of HTN non compliant with medication and to the ED with a chief complaint of vaginal discharge for the past 2 weeks. She reports this is a thick brown discharge to has been ongoing, with a foul smell to it sometimes. She thought this was likely a UTI, reports she has taken some Azo over-the-counter without improvement in her symptoms. Patient was sexually active 2 to 3 weeks ago, reports that she currently has 1 partner. She does have a prior history of chlamydia which she was treated for in the past. Patient also endorses some nausea. Does not have any urinary symptoms, abdominal pain, vomiting, other complaints.   The history is provided by the patient.  Vaginal Discharge Associated symptoms: nausea   Associated symptoms: no abdominal pain, no fever and no vomiting        Past Medical History:  Diagnosis Date   Anemia    history   Hypertension    SVD (spontaneous vaginal delivery)    x 3 - 1 set of twins    Patient Active Problem List   Diagnosis Date Noted   Candidiasis of vulva and vagina 08/13/2013   Excessive or frequent menstruation 12/31/2012   Urinary tract infection, site not specified 12/31/2012    Past Surgical History:  Procedure Laterality Date   CESAREAN SECTION     x 1   DILITATION & CURRETTAGE/HYSTROSCOPY WITH HYDROTHERMAL ABLATION N/A 12/18/2013   Procedure: DILATATION & CURETTAGE/HYSTEROSCOPY WITH HYDROTHERMAL ABLATION;  Surgeon: Brock Bad, MD;  Location: [facility];  Service: Gynecology;  Laterality: N/A;   TONSILLECTOMY     TUBAL LIGATION Bilateral    WISDOM TOOTH EXTRACTION       OB History    Gravida  5   Para  4   Term  3   Preterm  1   AB  1   Living  5     SAB    1   TAB  0   Ectopic  0   Multiple  1   Live Births  5        Obstetric Comments  Pt had twins with this pregnancy.        No family history on file.  Social History   Tobacco Use   Smoking status: Never Smoker   Smokeless tobacco: Never Used  Substance Use Topics   Alcohol use: Yes    Comment: Socially    Drug use: No    Home Medications Prior to Admission medications   Medication Sig Start Date End Date Taking? Authorizing Provider  clotrimazole (LOTRIMIN) 1 % cream Apply 1 application topically 2 (two) times daily. Patient not taking: Reported on 11/08/2015 01/25/14   Brock Bad, MD  ibuprofen (ADVIL,MOTRIN) 800 MG tablet Take 1 tablet (800 mg total) by mouth every 8 (eight) hours as needed. Patient not taking: Reported on 11/08/2015 12/18/13   Brock Bad, MD  lisinopril (PRINIVIL,ZESTRIL) 20 MG tablet TAKE 1 TABLET BY MOUTH EVERY DAY FOR BLOOD PRESSURE Patient not taking: Reported on 11/08/2015 03/26/13   Brock Bad, MD  meloxicam (MOBIC) 15 MG tablet Take 1 tablet (15 mg total) by mouth daily. Patient not taking: Reported on 11/08/2015  03/18/14   Alvina Chou, PA-C  methocarbamol (ROBAXIN) 500 MG tablet Take 1 tablet (500 mg total) by mouth 2 (two) times daily. Patient not taking: Reported on 12/16/2018 09/16/17   Ocie Cornfield T, PA-C  metroNIDAZOLE (FLAGYL) 500 MG tablet Take 1 tablet (500 mg total) by mouth 2 (two) times daily for 7 days. 01/09/20 01/16/20  Janeece Fitting, PA-C  naphazoline-pheniramine (NAPHCON-A) 0.025-0.3 % ophthalmic solution Place 1 drop into both eyes 4 (four) times daily as needed for eye irritation. Patient not taking: Reported on 12/16/2018 08/14/17   Rodell Perna A, PA-C  naproxen (NAPROSYN) 375 MG tablet Take 1 tablet (375 mg total) by mouth 2 (two) times daily. Patient not taking: Reported on 12/16/2018 09/16/17   Ocie Cornfield T, PA-C  tobramycin (TOBREX) 0.3 % ophthalmic solution Place 2 drops into both eyes every 4  (four) hours. Patient not taking: Reported on 12/16/2018 09/25/16   Fransico Meadow, PA-C    Allergies    Patient has no known allergies.  Review of Systems   Review of Systems  Constitutional: Negative for fever.  HENT: Negative for sore throat.   Respiratory: Negative for shortness of breath.   Cardiovascular: Negative for chest pain.  Gastrointestinal: Positive for nausea. Negative for abdominal pain and vomiting.  Genitourinary: Positive for vaginal discharge. Negative for decreased urine volume, difficulty urinating, flank pain and vaginal bleeding.  Musculoskeletal: Negative for back pain.  All other systems reviewed and are negative.   Physical Exam Updated Vital Signs BP (!) 174/103 (BP Location: Right Arm)    Pulse (!) 102    Temp 98.2 F (36.8 C) (Oral)    Resp 18    Ht 5\' 5"  (1.651 m)    Wt (!) 143.3 kg    SpO2 98%    BMI 52.59 kg/m   Physical Exam Vitals and nursing note reviewed. Exam conducted with a chaperone present.  Constitutional:      General: She is not in acute distress.    Appearance: Normal appearance. She is obese. She is not ill-appearing.  HENT:     Head: Normocephalic and atraumatic.     Nose: Nose normal.     Mouth/Throat:     Mouth: Mucous membranes are moist.  Eyes:     Pupils: Pupils are equal, round, and reactive to light.  Cardiovascular:     Rate and Rhythm: Normal rate.  Pulmonary:     Effort: Pulmonary effort is normal.  Abdominal:     General: Abdomen is flat.     Tenderness: There is no abdominal tenderness.  Genitourinary:    Exam position: Supine.     Pubic Area: No rash.      Labia:        Right: No tenderness.        Left: No tenderness.      Comments: Foul white discharge noted in vaginal vault. No strawberry cervix. No CMT or adnexal tenderness. Chaperoned by nurse tech. Skin:    General: Skin is warm and dry.  Neurological:     Mental Status: She is alert and oriented to person, place, and time.     ED Results /  Procedures / Treatments   Labs (all labs ordered are listed, but only abnormal results are displayed) Labs Reviewed  WET PREP, GENITAL - Abnormal; Notable for the following components:      Result Value   Clue Cells Wet Prep HPF POC PRESENT (*)    WBC, Wet Prep HPF POC MODERATE (*)  All other components within normal limits  URINALYSIS, ROUTINE W REFLEX MICROSCOPIC - Abnormal; Notable for the following components:   Color, Urine AMBER (*)    APPearance CLOUDY (*)    Protein, ur 30 (*)    Leukocytes,Ua SMALL (*)    Bacteria, UA MANY (*)    All other components within normal limits  RAPID HIV SCREEN (HIV 1/2 AB+AG)  GC/CHLAMYDIA PROBE AMP ([place]) NOT AT [facility]    EKG None  Radiology No results found.  Procedures Procedures (including critical care time)  Medications Ordered in ED Medications - No data to display  ED Course  I have reviewed the triage vital signs and the nursing notes.  Pertinent labs & imaging results that were available during my care of the patient were reviewed by me and considered in my medical decision making (see chart for details).  Clinical Course as of Jan 08 2122  Sat Jan 09, 2020  2110 Clue Cells Wet Prep HPF POC(!): PRESENT [JS]  2121 WBC, Wet Prep HPF POC(!): MODERATE [JS]    Clinical Course User Index [JS] Claude Manges, PA-C   MDM Rules/Calculators/A&P   Patient with a past medical history of chlamydia presents to the ED with complaints of vaginal discharge for the past 2 weeks. She reports a foul brown odor discharge noted in her vaginal vault. She thought she likely had a UTI, has been taking Azo over-the-counter without improvement in her symptoms. Patient does have a history of hypertension, has been noncompliant with medication, she did arrive in the ED hypertensive with a systolic in the one seventies.  Does report some nausea with her vaginal discharge but no abdominal pain, chest pain, shortness of breath.  Pelvic exam  performed by me with nurse tech at the bedside, white discharge noted on vaginal vault, foul odor noted in vaginal canal. No CMT or adnexal tenderness noted. Abdomen is soft, nontender to palpation. She is reporting concern for HIV infection and would like testing on today's visit, she was provided with a rapid screening via blood work.  Wet prep showed clue cells, I discussed these results with patient at length.  She will be treated with Flagyl in an outpatient basis, she was educated on medication causing severe nausea if she decides to combine this with alcohol.  Patient returns agrees with management, discharged in stable condition.   Portions of this note were generated with Scientist, clinical (histocompatibility and immunogenetics). Dictation errors may occur despite best attempts at proofreading.  Final Clinical Impression(s) / ED Diagnoses Final diagnoses:  Vaginal discharge  BV (bacterial vaginosis)    Rx / DC Orders ED Discharge Orders         Ordered    metroNIDAZOLE (FLAGYL) 500 MG tablet  2 times daily     01/09/20 2121           Claude Manges, PA-C 01/09/20 2123    Gerhard Munch, MD 01/10/20 2105

## 2020-01-09 NOTE — Discharge Instructions (Addendum)
I have prescribed a short prescription of antibiotics to help treat your bacterial vaginosis, please take 1 tablet twice a day for the next 7 days.   Please do not drink or drive while taking this medication, as it can cause severe nausea and vomiting.

## 2020-01-09 NOTE — ED Notes (Signed)
Pt states she has to go to work, she can wait no longer

## 2020-01-09 NOTE — ED Triage Notes (Addendum)
C/o vaginal discharge x 2 weeks. Pt came in last night for same and LWBS this morning.  Pt hypertensive.  Doesn't take BP medication.

## 2020-01-11 LAB — GC/CHLAMYDIA PROBE AMP (~~LOC~~) NOT AT ARMC
Chlamydia: NEGATIVE
Comment: NEGATIVE
Comment: NORMAL
Neisseria Gonorrhea: NEGATIVE

## 2020-03-14 ENCOUNTER — Encounter (HOSPITAL_COMMUNITY): Payer: Self-pay | Admitting: Emergency Medicine

## 2020-03-14 ENCOUNTER — Emergency Department (HOSPITAL_COMMUNITY)
Admission: EM | Admit: 2020-03-14 | Discharge: 2020-03-15 | Disposition: A | Discharge status: Home / Self Care | Payer: Medicaid Other | Attending: Emergency Medicine | Admitting: Emergency Medicine

## 2020-03-14 ENCOUNTER — Telehealth: Payer: Self-pay | Admitting: General Practice

## 2020-03-14 ENCOUNTER — Other Ambulatory Visit: Payer: Self-pay

## 2020-03-14 DIAGNOSIS — Z5321 Procedure and treatment not carried out due to patient leaving prior to being seen by health care provider: Secondary | ICD-10-CM | POA: Insufficient documentation

## 2020-03-14 DIAGNOSIS — B373 Candidiasis of vulva and vagina: Secondary | ICD-10-CM | POA: Insufficient documentation

## 2020-03-14 NOTE — Telephone Encounter (Signed)
Patient calling to ask if she was checked for herpes during ER visit in 01/2020. Patient states that she had unprotected sex with someone in May and was notified that the individual had herpes. Patient states she has not experienced any pain or other symptoms but does have some discomfort in the genital area. Patient states that she went for a wax before going to the beach and noticed a bump but was not sure if it was a hair bump. Patient states that she thought she did notice something on her genital area but she was unsure. Patient does not have a PCP. Patient advised to return to ED for evaluation of current symptoms. Understanding verbalized.

## 2020-03-14 NOTE — ED Triage Notes (Signed)
Patient reports vaginal itching onset last week , requesting treatment for for yeast infection .

## 2020-03-15 NOTE — ED Notes (Signed)
Called for vitals re-check no answer.  

## 2020-09-08 ENCOUNTER — Encounter (HOSPITAL_COMMUNITY): Payer: Self-pay | Admitting: Emergency Medicine

## 2020-09-08 ENCOUNTER — Ambulatory Visit (HOSPITAL_COMMUNITY)
Admission: EM | Admit: 2020-09-08 | Discharge: 2020-09-08 | Disposition: A | Discharge status: Home / Self Care | Payer: Self-pay | Attending: Family Medicine | Admitting: Family Medicine

## 2020-09-08 ENCOUNTER — Other Ambulatory Visit: Payer: Self-pay

## 2020-09-08 DIAGNOSIS — N898 Other specified noninflammatory disorders of vagina: Secondary | ICD-10-CM

## 2020-09-08 MED ORDER — METRONIDAZOLE 500 MG PO TABS: 500.0000 mg | ORAL_TABLET | Freq: Two times a day (BID) | ORAL | 0 refills | Status: DC

## 2020-09-08 NOTE — Discharge Instructions (Addendum)
We have sent testing for sexually transmitted infections. We will notify you of any positive results once they are received. If required, we will prescribe any medications you might need.  Please refrain from all sexual activity for at least the next seven days.  

## 2020-09-08 NOTE — ED Triage Notes (Signed)
Pt presents with vaginal discharge xs 1 month. Denies itching or dysuria, abdominal pain or back pain.

## 2020-09-09 LAB — CERVICOVAGINAL ANCILLARY ONLY
Bacterial Vaginitis (gardnerella): POSITIVE — AB
Candida Glabrata: NEGATIVE
Candida Vaginitis: NEGATIVE
Chlamydia: NEGATIVE
Comment: NEGATIVE
Comment: NEGATIVE
Comment: NEGATIVE
Comment: NEGATIVE
Comment: NEGATIVE
Comment: NORMAL
Neisseria Gonorrhea: NEGATIVE
Trichomonas: NEGATIVE

## 2020-09-13 NOTE — ED Provider Notes (Signed)
  [facility]   [id number] 09/08/20 Arrival Time: 1953  ASSESSMENT & PLAN:  1. Vaginal discharge     Will tx empirically for BV given history. Meds ordered this encounter  Medications  . metroNIDAZOLE (FLAGYL) 500 MG tablet    Sig: Take 1 tablet (500 mg total) by mouth 2 (two) times daily.    Dispense:  14 tablet    Refill:  0      Discharge Instructions     We have sent testing for sexually transmitted infections. We will notify you of any positive results once they are received. If required, we will prescribe any medications you might need.  Please refrain from all sexual activity for at least the next seven days.    Without s/s of PID.  Cytology pending.   Reviewed expectations re: course of current medical issues. Questions answered. Outlined signs and symptoms indicating need for more acute intervention. Patient verbalized understanding. After Visit Summary given.   SUBJECTIVE:  Allison Dennis is a 45 y.o. female who presents with complaint of vaginal discharge. Onset gradual. First noticed about a month ago. Describes discharge as thin and clear; without odor. No specific aggravating or alleviating factors reported. Denies: urinary frequency, dysuria and gross hematuria. Afebrile. No abdominal or pelvic pain. Normal PO intake wihout n/v. No genital rashes or lesions.  No LMP recorded. Patient has had an ablation.   OBJECTIVE:  Vitals:   09/08/20 2012  BP: (!) 163/99  Pulse: 95  Resp: 18  Temp: 98.5 F (36.9 C)  TempSrc: Oral  SpO2: 98%     General appearance: alert, cooperative, appears stated age and no distress Lungs: unlabored respirations; speaks full sentences without difficulty Back: no CVA tenderness; FROM at waist Abdomen: soft, non-tender GU: deferred Skin: warm and dry Psychological: alert and cooperative; normal mood and affect.   No Known Allergies  Past Medical History:  Diagnosis Date  . Anemia    history  .  Hypertension   . SVD (spontaneous vaginal delivery)    x 3 - 1 set of twins   History reviewed. No pertinent family history. Social History   Socioeconomic History  . Marital status: Single    Spouse name: Not on file  . Number of children: Not on file  . Years of education: Not on file  . Highest education level: Not on file  Occupational History  . Not on file  Tobacco Use  . Smoking status: Never Smoker  . Smokeless tobacco: Never Used  Substance and Sexual Activity  . Alcohol use: Yes    Comment: Socially   . Drug use: No  . Sexual activity: Not Currently    Partners: Male    Birth control/protection: Surgical  Other Topics Concern  . Not on file  Social History Narrative  . Not on file   Social Determinants of Health   Financial Resource Strain: Not on file  Food Insecurity: Not on file  Transportation Needs: Not on file  Physical Activity: Not on file  Stress: Not on file  Social Connections: Not on file  Intimate Partner Violence: Not on file          Mardella Layman, MD 09/13/20 [phone]

## 2020-09-14 ENCOUNTER — Ambulatory Visit (HOSPITAL_COMMUNITY)
Admission: EM | Admit: 2020-09-14 | Discharge: 2020-09-14 | Disposition: A | Discharge status: Home / Self Care | Payer: Medicaid Other | Attending: Family Medicine | Admitting: Family Medicine

## 2020-09-14 ENCOUNTER — Encounter (HOSPITAL_COMMUNITY): Payer: Self-pay

## 2020-09-14 ENCOUNTER — Other Ambulatory Visit: Payer: Self-pay

## 2020-09-14 DIAGNOSIS — Z01812 Encounter for preprocedural laboratory examination: Secondary | ICD-10-CM | POA: Insufficient documentation

## 2020-09-14 DIAGNOSIS — N760 Acute vaginitis: Secondary | ICD-10-CM | POA: Insufficient documentation

## 2020-09-14 DIAGNOSIS — Z20822 Contact with and (suspected) exposure to covid-19: Secondary | ICD-10-CM | POA: Insufficient documentation

## 2020-09-14 DIAGNOSIS — B9689 Other specified bacterial agents as the cause of diseases classified elsewhere: Secondary | ICD-10-CM | POA: Insufficient documentation

## 2020-09-14 MED ORDER — CLINDAMYCIN HCL 300 MG PO CAPS: 300.0000 mg | ORAL_CAPSULE | Freq: Two times a day (BID) | ORAL | 0 refills | Status: DC

## 2020-09-14 NOTE — Discharge Instructions (Addendum)
You have been tested for COVID-19 today. °If your test returns positive, you will receive a phone call from [facility] regarding your results. °Negative test results are not called. °Both positive and negative results area always visible on MyChart. °If you do not have a MyChart account, sign up instructions are provided in your discharge papers. °Please do not hesitate to contact us should you have questions or concerns. ° °

## 2020-09-14 NOTE — ED Triage Notes (Addendum)
Pt in requesting medication change. Has been taking flagyl for BV but is having difficulty with pills

## 2020-09-14 NOTE — ED Provider Notes (Signed)
[facility]   [id number] 09/14/20 Arrival Time: 1333  ASSESSMENT & PLAN:  1. BV (bacterial vaginosis)   2. Encounter for preoperative screening laboratory testing for COVID-19 virus     COVID-19 testing sent. See letter/work note on file for self-isolation guidelines. OTC symptom care as needed.  Stop Flagyl. Begin: Meds ordered this encounter  Medications  . clindamycin (CLEOCIN) 300 MG capsule    Sig: Take 1 capsule (300 mg total) by mouth 2 (two) times daily.    Dispense:  14 capsule    Refill:  0     Follow-up Information    [facility].   Specialty: Urgent Care Why: As needed. Contact information: [address] [place] [address] [phone]              Reviewed expectations re: course of current medical issues. Questions answered. Outlined signs and symptoms indicating need for more acute intervention. Understanding verbalized. After Visit Summary given.   SUBJECTIVE: History from: patient. Allison Dennis is a 45 y.o. female who presents with worries regarding COVID-19. Known COVID-19 contact: child is sick with cold symptoms; being tested for COVID. Recent travel: none. Reports: no URI symptoms. Denies: fever. Normal PO intake without n/v/d.  Also on Flagyl for BV. Not tolerating well. Prefers different medication.  OBJECTIVE:  Vitals:   09/14/20 1353  BP: (!) 148/81  Pulse: 94  Resp: 18  Temp: 98.6 F (37 C)  TempSrc: Oral  SpO2: 100%    General appearance: alert; no distress Eyes: PERRLA; EOMI; conjunctiva normal HENT: [place]; AT; without nasal congestion Neck: supple  Lungs: speaks full sentences without difficulty; unlabored Extremities: no edema Skin: warm and dry Neurologic: normal gait Psychological: alert and cooperative; normal mood and affect    No Known Allergies  Past Medical History:  Diagnosis Date  . Anemia    history  . Hypertension   . SVD (spontaneous vaginal  delivery)    x 3 - 1 set of twins   Social History   Socioeconomic History  . Marital status: Single    Spouse name: Not on file  . Number of children: Not on file  . Years of education: Not on file  . Highest education level: Not on file  Occupational History  . Not on file  Tobacco Use  . Smoking status: Never Smoker  . Smokeless tobacco: Never Used  Substance and Sexual Activity  . Alcohol use: Yes    Comment: Socially   . Drug use: No  . Sexual activity: Not Currently    Partners: Male    Birth control/protection: Surgical  Other Topics Concern  . Not on file  Social History Narrative  . Not on file   Social Determinants of Health   Financial Resource Strain: Not on file  Food Insecurity: Not on file  Transportation Needs: Not on file  Physical Activity: Not on file  Stress: Not on file  Social Connections: Not on file  Intimate Partner Violence: Not on file   History reviewed. No pertinent family history. Past Surgical History:  Procedure Laterality Date  . CESAREAN SECTION     x 1  . DILITATION & CURRETTAGE/HYSTROSCOPY WITH HYDROTHERMAL ABLATION N/A 12/18/2013   Procedure: DILATATION & CURETTAGE/HYSTEROSCOPY WITH HYDROTHERMAL ABLATION;  Surgeon: Brock Bad, MD;  Location: [facility];  Service: Gynecology;  Laterality: N/A;  . TONSILLECTOMY    . TUBAL LIGATION Bilateral   . WISDOM TOOTH EXTRACTION  Mardella Layman, MD 09/14/20 1415

## 2020-09-15 LAB — SARS CORONAVIRUS 2 (TAT 6-24 HRS): SARS Coronavirus 2: NEGATIVE

## 2020-10-20 ENCOUNTER — Other Ambulatory Visit: Payer: Self-pay

## 2020-10-20 ENCOUNTER — Ambulatory Visit (HOSPITAL_COMMUNITY)
Admission: EM | Admit: 2020-10-20 | Discharge: 2020-10-20 | Disposition: A | Discharge status: Home / Self Care | Payer: Self-pay | Attending: Family Medicine | Admitting: Family Medicine

## 2020-10-20 ENCOUNTER — Encounter (HOSPITAL_COMMUNITY): Payer: Self-pay | Admitting: Emergency Medicine

## 2020-10-20 DIAGNOSIS — B9689 Other specified bacterial agents as the cause of diseases classified elsewhere: Secondary | ICD-10-CM

## 2020-10-20 DIAGNOSIS — N760 Acute vaginitis: Secondary | ICD-10-CM

## 2020-10-20 DIAGNOSIS — R21 Rash and other nonspecific skin eruption: Secondary | ICD-10-CM

## 2020-10-20 MED ORDER — TRIAMCINOLONE ACETONIDE 0.1 % EX CREA: 1.0000 "application " | TOPICAL_CREAM | Freq: Two times a day (BID) | CUTANEOUS | 0 refills | Status: DC | PRN

## 2020-10-20 MED ORDER — METRONIDAZOLE 0.75 % VA GEL: 1.0000 | Freq: Every day | VAGINAL | 1 refills | Status: DC

## 2020-10-20 NOTE — ED Triage Notes (Signed)
Pt states that she has frequency and vaginal discharge. Pt states that she was prescribed pills the last time she was here for the same issue but she is not able to take them. Pt would like a cream instead. Pt states that she also has skin irritation on the left side of her neck.

## 2020-10-20 NOTE — Discharge Instructions (Signed)
May try boric acid suppositories and probiotics as a good maintenance and prevention regimen

## 2020-10-24 NOTE — ED Provider Notes (Signed)
[facility]    CSN: [id number] Arrival date & time: 10/20/20  1924      History   Chief Complaint Chief Complaint  Patient presents with  . Vaginal Discharge    HPI Allison Dennis is a 45 y.o. female.   Presenting today with urinary frequency and vaginal discharge for several weeks now. Was recently given clindamycin for treatment of BV but states she cannot take the medication by mouth, asking for a topical treatment for her ongoing BV. Denies dysuria, hematuria, flank pain, fever, chills, N/V. Denies concern for STIs. Hx of uterine ablation.      Past Medical History:  Diagnosis Date  . Anemia    history  . Hypertension   . SVD (spontaneous vaginal delivery)    x 3 - 1 set of twins    Patient Active Problem List   Diagnosis Date Noted  . Candidiasis of vulva and vagina 08/13/2013  . Excessive or frequent menstruation 12/31/2012  . Urinary tract infection, site not specified 12/31/2012    Past Surgical History:  Procedure Laterality Date  . CESAREAN SECTION     x 1  . DILITATION & CURRETTAGE/HYSTROSCOPY WITH HYDROTHERMAL ABLATION N/A 12/18/2013   Procedure: DILATATION & CURETTAGE/HYSTEROSCOPY WITH HYDROTHERMAL ABLATION;  Surgeon: Brock Bad, MD;  Location: [facility];  Service: Gynecology;  Laterality: N/A;  . TONSILLECTOMY    . TUBAL LIGATION Bilateral   . WISDOM TOOTH EXTRACTION      OB History    Gravida  5   Para  4   Term  3   Preterm  1   AB  1   Living  5     SAB  1   IAB  0   Ectopic  0   Multiple  1   Live Births  5        Obstetric Comments  Pt had twins with this pregnancy.         Home Medications    Prior to Admission medications   Medication Sig Start Date End Date Taking? Authorizing Provider  metroNIDAZOLE (METROGEL) 0.75 % vaginal gel Place 1 Applicatorful vaginally at bedtime. 10/20/20  Yes Particia Nearing, PA-C  triamcinolone (KENALOG) 0.1 % Apply 1 application topically 2 (two) times daily  as needed. 10/20/20  Yes Particia Nearing, PA-C  clindamycin (CLEOCIN) 300 MG capsule Take 1 capsule (300 mg total) by mouth 2 (two) times daily. 09/14/20   Mardella Layman, MD  naphazoline-pheniramine (NAPHCON-A) 0.025-0.3 % ophthalmic solution Place 1 drop into both eyes 4 (four) times daily as needed for eye irritation. Patient not taking: No sig reported 08/14/17   Michela Pitcher A, PA-C  lisinopril (PRINIVIL,ZESTRIL) 20 MG tablet TAKE 1 TABLET BY MOUTH EVERY DAY FOR BLOOD PRESSURE Patient not taking: No sig reported 03/26/13 09/08/20  Brock Bad, MD    Family History History reviewed. No pertinent family history.  Social History Social History   Tobacco Use  . Smoking status: Never Smoker  . Smokeless tobacco: Never Used  Substance Use Topics  . Alcohol use: Yes    Comment: Socially   . Drug use: No     Allergies   Patient has no known allergies.   Review of Systems Review of Systems PER HPI    Physical Exam Triage Vital Signs ED Triage Vitals  Enc Vitals Group     BP 10/20/20 1940 (!) 176/109     Pulse Rate 10/20/20 1940 100  Resp 10/20/20 1940 18     Temp 10/20/20 1940 98.4 F (36.9 C)     Temp Source 10/20/20 1940 Oral     SpO2 10/20/20 1940 100 %     Weight --      Height --      Head Circumference --      Peak Flow --      Pain Score 10/20/20 2025 0     Pain Loc --      Pain Edu? --      Excl. in GC? --    No data found.  Updated Vital Signs BP (!) 176/109 (BP Location: Right Wrist)   Pulse 100   Temp 98.4 F (36.9 C) (Oral)   Resp 18   SpO2 100%   Visual Acuity Right Eye Distance:   Left Eye Distance:   Bilateral Distance:    Right Eye Near:   Left Eye Near:    Bilateral Near:     Physical Exam Vitals and nursing note reviewed.  Constitutional:      Appearance: Normal appearance. She is not ill-appearing.  HENT:     Head: Atraumatic.  Eyes:     Extraocular Movements: Extraocular movements intact.     Conjunctiva/sclera:  Conjunctivae normal.  Cardiovascular:     Rate and Rhythm: Normal rate and regular rhythm.     Heart sounds: Normal heart sounds.  Pulmonary:     Effort: Pulmonary effort is normal.     Breath sounds: Normal breath sounds.  Abdominal:     General: Bowel sounds are normal. There is no distension.     Palpations: Abdomen is soft.     Tenderness: There is no abdominal tenderness. There is no right CVA tenderness, left CVA tenderness or guarding.  Genitourinary:    Comments: GU exam deferred Musculoskeletal:        General: Normal range of motion.     Cervical back: Normal range of motion and neck supple.  Skin:    General: Skin is warm and dry.  Neurological:     Mental Status: She is alert and oriented to person, place, and time.  Psychiatric:        Mood and Affect: Mood normal.        Thought Content: Thought content normal.        Judgment: Judgment normal.      UC Treatments / Results  Labs (all labs ordered are listed, but only abnormal results are displayed) Labs Reviewed - No data to display  EKG   Radiology No results found.  Procedures Procedures (including critical care time)  Medications Ordered in UC Medications - No data to display  Initial Impression / Assessment and Plan / UC Course  I have reviewed the triage vital signs and the nursing notes.  Pertinent labs & imaging results that were available during my care of the patient were reviewed by me and considered in my medical decision making (see chart for details).     Multiple visits in the past month with aptima swabs positive for BV, unable to take oral treatment. Will treat with metronidazole vaginal gel, discussed probiotics, boric acid suppositories. Return for worsening or unresolving sxs.  Final Clinical Impressions(s) / UC Diagnoses   Final diagnoses:  BV (bacterial vaginosis)  Rash     Discharge Instructions     May try boric acid suppositories and probiotics as a good maintenance  and prevention regimen    ED Prescriptions    Medication  Sig Dispense Auth. Provider   triamcinolone (KENALOG) 0.1 % Apply 1 application topically 2 (two) times daily as needed. 60 g Particia Nearing, [place]   metroNIDAZOLE (METROGEL) 0.75 % vaginal gel Place 1 Applicatorful vaginally at bedtime. 140 g Particia Nearing, [place]     PDMP not reviewed this encounter.   Armoni, [place], [place] 10/24/20 [phone]

## 2021-02-14 ENCOUNTER — Other Ambulatory Visit: Payer: Self-pay

## 2021-02-14 ENCOUNTER — Ambulatory Visit (HOSPITAL_COMMUNITY)
Admission: EM | Admit: 2021-02-14 | Discharge: 2021-02-14 | Disposition: A | Discharge status: Home / Self Care | Payer: Self-pay

## 2021-02-14 NOTE — ED Notes (Signed)
Pt called for triage x2 with no answer, voicemail box full.

## 2021-02-14 NOTE — ED Notes (Signed)
Called designated number for patient .  Voice mail is full, not accepting any more messages.   Regina, patient access has called patient x 2 with the same full voicemail box.

## 2021-02-14 NOTE — ED Notes (Signed)
Called pt phone an additional time with no answer, went to voicemail but box full. Also called pt again in lobby with no answer.

## 2021-02-15 DIAGNOSIS — Z20822 Contact with and (suspected) exposure to covid-19: Secondary | ICD-10-CM | POA: Diagnosis not present

## 2021-05-01 ENCOUNTER — Ambulatory Visit: Payer: Self-pay | Admitting: Nurse Practitioner

## 2021-06-13 ENCOUNTER — Emergency Department (HOSPITAL_COMMUNITY)
Admission: EM | Admit: 2021-06-13 | Discharge: 2021-06-13 | Disposition: A | Discharge status: Home / Self Care | Payer: Self-pay | Attending: Emergency Medicine | Admitting: Emergency Medicine

## 2021-06-13 ENCOUNTER — Emergency Department (HOSPITAL_COMMUNITY): Payer: Self-pay

## 2021-06-13 ENCOUNTER — Encounter (HOSPITAL_COMMUNITY): Payer: Self-pay | Admitting: Emergency Medicine

## 2021-06-13 ENCOUNTER — Other Ambulatory Visit: Payer: Self-pay

## 2021-06-13 DIAGNOSIS — I10 Essential (primary) hypertension: Secondary | ICD-10-CM | POA: Insufficient documentation

## 2021-06-13 DIAGNOSIS — L409 Psoriasis, unspecified: Secondary | ICD-10-CM

## 2021-06-13 DIAGNOSIS — W1839XA Other fall on same level, initial encounter: Secondary | ICD-10-CM | POA: Insufficient documentation

## 2021-06-13 DIAGNOSIS — Z79899 Other long term (current) drug therapy: Secondary | ICD-10-CM | POA: Insufficient documentation

## 2021-06-13 DIAGNOSIS — M25561 Pain in right knee: Secondary | ICD-10-CM | POA: Insufficient documentation

## 2021-06-13 DIAGNOSIS — S86911A Strain of unspecified muscle(s) and tendon(s) at lower leg level, right leg, initial encounter: Secondary | ICD-10-CM

## 2021-06-13 DIAGNOSIS — H00015 Hordeolum externum left lower eyelid: Secondary | ICD-10-CM | POA: Insufficient documentation

## 2021-06-13 MED ORDER — NAPROXEN 375 MG PO TABS: 375.0000 mg | ORAL_TABLET | Freq: Two times a day (BID) | ORAL | 0 refills | Status: DC

## 2021-06-13 MED ORDER — ERYTHROMYCIN 5 MG/GM OP OINT: TOPICAL_OINTMENT | OPHTHALMIC | 0 refills | Status: DC

## 2021-06-13 MED ORDER — TRIAMCINOLONE ACETONIDE 0.1 % EX CREA: 1.0000 "application " | TOPICAL_CREAM | Freq: Two times a day (BID) | CUTANEOUS | 0 refills | Status: DC

## 2021-06-13 NOTE — Discharge Instructions (Signed)
Get help right away if: You cannot use your injured knee to support any of your body weight (cannot bear weight). You cannot move the injured joint. You cannot walk more than a few steps without pain or without your knee buckling. You have significant pain, swelling, or numbness in the leg below the cast, brace, or splint. Your foot or toes are numb, cold, or blue after loosening your splint or brace. 

## 2021-06-13 NOTE — ED Triage Notes (Signed)
Patient reports left eye pain and right knee pain onset last week , denies injury /no vision loss.

## 2021-06-13 NOTE — ED Provider Notes (Signed)
Emergency Medicine Provider Triage Evaluation Note  Allison Dennis , a 45 y.o. female  was evaluated in triage.  Pt complains of eye irritation x 1 week and right knee pain x 2 weeks.   Eyes are irritated and uncomfortable bilaterally, left has drainage in the AM. Not a contact lens wearer. Had had some congestion.   R knee pain after she stepped wrong  Review of Systems  Positive: Eye pain/irritation, knee pain Negative: Blurry vision, numbness, weakness, fever.   Physical Exam  There were no vitals taken for this visit. Gen:   Awake, no distress   Resp:  Normal effort  MSK:   Moves extremities without difficulty  Other:  PERRL. EOMI. No periorbital edema/erythema. Minimal conjunctival injection. Anterior right knee TTP.   Medical Decision Making  Medically screening exam initiated at 2:30 AM.  Appropriate orders placed.  Allison Dennis was informed that the remainder of the evaluation will be completed by another provider, this initial triage assessment does not replace that evaluation, and the importance of remaining in the ED until their evaluation is complete.  Knee pain, eye irritation.    Desmond Lope 06/13/21 0233    Nira Conn, MD 06/13/21 0345

## 2021-06-13 NOTE — ED Notes (Signed)
Pt verbalized understanding of d/c instructions, meds and followup care. Denies questions. VSS, no distress noted. Steady gait to exit with all belongings.  ?

## 2021-06-13 NOTE — ED Provider Notes (Signed)
[facility] EMERGENCY DEPARTMENT Provider Note   CSN: [id number] Arrival date & time: 06/13/21  0109     History Chief Complaint  Patient presents with   Eye Pain / Knee Pain     Allison Dennis is a 45 y.o. femalewho presents to the ED for L eye irritation, psoriasis flare and R knee pain x 1 week. Pt states her eye discomfort is mostly L sided with pain to her lower lid and some occasional clear discharge and crusting in the mornings. She denies any vision changes, itch, HA or photophobia. She does not wear contacts and has not used any otic medications for her symptoms. Pt is also concerned for a psoriasis flare to the dorsal aspect of her neck. She is requesting a steroid cream today as this has worked for her in the past. Pt denies any itch, pain or spread of the area. She states it feels exactly the same as her previous flares. She denies any recent changes to soap, lotions or detergents. She also has concern for some R knee pain. She states she stepped off of a step incorrectly and heard a pop, which caused her knee to buckle. She states she did not fall, but since has had some mild swelling and feels a "knot" at the back of her knee. She can ambulate without limitation, but does feel like the knee is slightly unstable. She denies any deficits to her ROM, but states her knee feels "tight" with flexion. She also denies any sensory changes, bruising or discoloration of the knee. She has not tried any ice, elevation or otc pain medication.   HPI     Past Medical History:  Diagnosis Date   Anemia    history   Hypertension    SVD (spontaneous vaginal delivery)    x 3 - 1 set of twins    Patient Active Problem List   Diagnosis Date Noted   Candidiasis of vulva and vagina 08/13/2013   Excessive or frequent menstruation 12/31/2012   Urinary tract infection, site not specified 12/31/2012    Past Surgical History:  Procedure Laterality Date   CESAREAN SECTION      x 1   DILITATION & CURRETTAGE/HYSTROSCOPY WITH HYDROTHERMAL ABLATION N/A 12/18/2013   Procedure: DILATATION & CURETTAGE/HYSTEROSCOPY WITH HYDROTHERMAL ABLATION;  Surgeon: Brock Bad, MD;  Location: [facility];  Service: Gynecology;  Laterality: N/A;   TONSILLECTOMY     TUBAL LIGATION Bilateral    WISDOM TOOTH EXTRACTION       OB History     Gravida  5   Para  4   Term  3   Preterm  1   AB  1   Living  5      SAB  1   IAB  0   Ectopic  0   Multiple  1   Live Births  5        Obstetric Comments  Pt had twins with this pregnancy.         No family history on file.  Social History   Tobacco Use   Smoking status: Never   Smokeless tobacco: Never  Substance Use Topics   Alcohol use: Yes    Comment: Socially    Drug use: No    Home Medications Prior to Admission medications   Medication Sig Start Date End Date Taking? Authorizing Provider  clindamycin (CLEOCIN) 300 MG capsule Take 1 capsule (300 mg total) by mouth 2 (two) times  daily. 09/14/20   Mardella Layman, MD  metroNIDAZOLE (METROGEL) 0.75 % vaginal gel Place 1 Applicatorful vaginally at bedtime. 10/20/20   Particia Nearing, PA-C  naphazoline-pheniramine (NAPHCON-A) 0.025-0.3 % ophthalmic solution Place 1 drop into both eyes 4 (four) times daily as needed for eye irritation. Patient not taking: No sig reported 08/14/17   Michela Pitcher A, PA-C  triamcinolone (KENALOG) 0.1 % Apply 1 application topically 2 (two) times daily as needed. 10/20/20   Particia Nearing, PA-C  lisinopril (PRINIVIL,ZESTRIL) 20 MG tablet TAKE 1 TABLET BY MOUTH EVERY DAY FOR BLOOD PRESSURE Patient not taking: No sig reported 03/26/13 09/08/20  Brock Bad, MD    Allergies    Patient has no known allergies.  Review of Systems   Review of Systems Ten systems reviewed and are negative for acute change, except as noted in the HPI.   Physical Exam Updated Vital Signs BP (!) 148/87   Pulse 88   Temp 97.6 F (36.4 C)  (Oral)   Resp 14   Ht 5\' 5"  (1.651 m)   Wt (!) 140 kg   SpO2 98%   BMI 51.36 kg/m   Physical Exam Vitals and nursing note reviewed.  Constitutional:      General: She is not in acute distress.    Appearance: She is well-developed. She is not diaphoretic.  HENT:     Head: Normocephalic and atraumatic.     Right Ear: External ear normal.     Left Ear: External ear normal.     Nose: Nose normal.     Mouth/Throat:     Mouth: Mucous membranes are moist.  Eyes:     General: No scleral icterus.       Left eye: Hordeolum (Left lower) present.    Conjunctiva/sclera: Conjunctivae normal.  Cardiovascular:     Rate and Rhythm: Normal rate and regular rhythm.     Heart sounds: Normal heart sounds. No murmur heard.   No friction rub. No gallop.  Pulmonary:     Effort: Pulmonary effort is normal. No respiratory distress.     Breath sounds: Normal breath sounds.  Abdominal:     General: Bowel sounds are normal. There is no distension.     Palpations: Abdomen is soft. There is no mass.     Tenderness: There is no abdominal tenderness. There is no guarding.  Musculoskeletal:     Cervical back: Normal range of motion.  Skin:    General: Skin is warm and dry.  Neurological:     Mental Status: She is alert and oriented to person, place, and time.  Psychiatric:        Behavior: Behavior normal.    ED Results / Procedures / Treatments   Labs (all labs ordered are listed, but only abnormal results are displayed) Labs Reviewed - No data to display  EKG None  Radiology DG Knee Complete 4 Views Right  Result Date: 06/13/2021 CLINICAL DATA:  Fall and trauma to the right knee. EXAM: RIGHT KNEE - COMPLETE 4+ VIEW COMPARISON:  None. FINDINGS: No evidence of fracture, dislocation, or joint effusion. No evidence of arthropathy or other focal bone abnormality. Soft tissues are unremarkable. IMPRESSION: Negative. Electronically Signed   By: 13/03/2021 M.D.   On: 06/13/2021 03:13     Procedures Procedures   Medications Ordered in ED Medications - No data to display  ED Course  I have reviewed the triage vital signs and the nursing notes.  Pertinent labs & imaging  results that were available during my care of the patient were reviewed by me and considered in my medical decision making (see chart for details).    MDM Rules/Calculators/A&P 45 year old female here with multiple complaints.  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  We will treat with erythromycin ointment.  Patient has knee pain.  Will treat with knee sleeve and anti-inflammatories.  I reviewed images of her right knee x-ray which showed no acute abnormalities.  Patient has psoriatic flare help with Kenalog.  We will treat Kenalog.  No evidence of other emergent cause of any of her symptoms.  Patient given referral to ophthalmology, outpatient follow-up on her hypertension.  She appears otherwise appropriate for discharge at this time. Final Clinical Impression(s) / ED Diagnoses Final diagnoses:  None    Rx / DC Orders ED Discharge Orders     None        Arthor Captain, PA-C 06/13/21 1517    Gwyneth Sprout, MD 06/13/21 1519

## 2021-07-23 ENCOUNTER — Ambulatory Visit (HOSPITAL_COMMUNITY)
Admission: EM | Admit: 2021-07-23 | Discharge: 2021-07-23 | Disposition: A | Discharge status: Home / Self Care | Payer: Medicaid Other | Attending: Family Medicine | Admitting: Family Medicine

## 2021-07-23 ENCOUNTER — Other Ambulatory Visit: Payer: Self-pay

## 2021-07-23 ENCOUNTER — Encounter (HOSPITAL_COMMUNITY): Payer: Self-pay | Admitting: Emergency Medicine

## 2021-07-23 DIAGNOSIS — R051 Acute cough: Secondary | ICD-10-CM | POA: Insufficient documentation

## 2021-07-23 DIAGNOSIS — J069 Acute upper respiratory infection, unspecified: Secondary | ICD-10-CM | POA: Insufficient documentation

## 2021-07-23 DIAGNOSIS — Z87440 Personal history of urinary (tract) infections: Secondary | ICD-10-CM | POA: Insufficient documentation

## 2021-07-23 DIAGNOSIS — Z20822 Contact with and (suspected) exposure to covid-19: Secondary | ICD-10-CM | POA: Insufficient documentation

## 2021-07-23 DIAGNOSIS — N3090 Cystitis, unspecified without hematuria: Secondary | ICD-10-CM | POA: Insufficient documentation

## 2021-07-23 LAB — POCT URINALYSIS DIPSTICK, ED / UC
Bilirubin Urine: NEGATIVE
Glucose, UA: NEGATIVE mg/dL
Hgb urine dipstick: NEGATIVE
Leukocytes,Ua: NEGATIVE
Nitrite: NEGATIVE
Protein, ur: NEGATIVE mg/dL
Specific Gravity, Urine: >=1.03 (ref 1.005–1.030)
Urobilinogen, UA: 0.2 mg/dL (ref 0.0–1.0)
pH: 6 (ref 5.0–8.0)

## 2021-07-23 MED ORDER — NITROFURANTOIN MONOHYD MACRO 100 MG PO CAPS: 100.0000 mg | ORAL_CAPSULE | Freq: Two times a day (BID) | ORAL | 0 refills | Status: AC

## 2021-07-23 NOTE — Discharge Instructions (Addendum)
Nitrofurantoin 100 mg twice daily for 7 days for UTI.  Staff will call you if your covid swab is positive, and you can check your mychart. If pos, then quarantine 5 days from today.

## 2021-07-23 NOTE — ED Triage Notes (Signed)
Pt reports congestion, cough, ear fullness, and sore throat for a couple days.  Pt adds that for a week having urinary frequency. Denies pain or blood in urine.

## 2021-07-23 NOTE — ED Provider Notes (Signed)
[facility]    CSN: [id number] Arrival date & time: 07/23/21  1631      History   Chief Complaint Chief Complaint  Patient presents with   Cough   Sore Throat    HPI Allison Dennis is a 45 y.o. female.    Cough Sore Throat  Here for 3 day h/o cough, rhinorrhea, and sore throat. No f/c. No n/v/d. Feels heavy in her chest.  Also has had urinary frequency, though no dysuria. Has had UTI in the past without dysuria.  LMP had had ablation  Past Medical History:  Diagnosis Date   Anemia    history   Hypertension    SVD (spontaneous vaginal delivery)    x 3 - 1 set of twins    Patient Active Problem List   Diagnosis Date Noted   Candidiasis of vulva and vagina 08/13/2013   Excessive or frequent menstruation 12/31/2012   Urinary tract infection, site not specified 12/31/2012    Past Surgical History:  Procedure Laterality Date   CESAREAN SECTION     x 1   DILITATION & CURRETTAGE/HYSTROSCOPY WITH HYDROTHERMAL ABLATION N/A 12/18/2013   Procedure: DILATATION & CURETTAGE/HYSTEROSCOPY WITH HYDROTHERMAL ABLATION;  Surgeon: Brock Bad, MD;  Location: [facility];  Service: Gynecology;  Laterality: N/A;   TONSILLECTOMY     TUBAL LIGATION Bilateral    WISDOM TOOTH EXTRACTION      OB History     Gravida  5   Para  4   Term  3   Preterm  1   AB  1   Living  5      SAB  1   IAB  0   Ectopic  0   Multiple  1   Live Births  5        Obstetric Comments  Pt had twins with this pregnancy.          Home Medications    Prior to Admission medications   Medication Sig Start Date End Date Taking? Authorizing Provider  nitrofurantoin, macrocrystal-monohydrate, (MACROBID) 100 MG capsule Take 1 capsule (100 mg total) by mouth 2 (two) times daily for 7 days. 07/23/21 07/30/21 Yes Zenia Resides, MD  erythromycin ophthalmic ointment Place a 1/2 inch ribbon of ointment into the lower eyelid 4 x day for 5 days 06/13/21   Arthor Captain,  PA-C  naphazoline-pheniramine (NAPHCON-A) 0.025-0.3 % ophthalmic solution Place 1 drop into both eyes 4 (four) times daily as needed for eye irritation. Patient not taking: No sig reported 08/14/17   Michela Pitcher A, PA-C  naproxen (NAPROSYN) 375 MG tablet Take 1 tablet (375 mg total) by mouth 2 (two) times daily with a meal. 06/13/21   Harris, Abigail, PA-C  triamcinolone (KENALOG) 0.1 % Apply 1 application topically 2 (two) times daily as needed. 10/20/20   Particia Nearing, PA-C  triamcinolone cream (KENALOG) 0.1 % Apply 1 application topically 2 (two) times daily. 06/13/21   Harris, Cammy Copa, PA-C  lisinopril (PRINIVIL,ZESTRIL) 20 MG tablet TAKE 1 TABLET BY MOUTH EVERY DAY FOR BLOOD PRESSURE Patient not taking: No sig reported 03/26/13 09/08/20  Brock Bad, MD    Family History No family history on file.  Social History Social History   Tobacco Use   Smoking status: Never   Smokeless tobacco: Never  Substance Use Topics   Alcohol use: Yes    Comment: Socially    Drug use: No     Allergies   Patient has  no known allergies.   Review of Systems Review of Systems  Respiratory:  Positive for cough.     Physical Exam Triage Vital Signs ED Triage Vitals  Enc Vitals Group     BP 07/23/21 1738 (!) 163/116     Pulse Rate 07/23/21 1738 81     Resp 07/23/21 1738 18     Temp 07/23/21 1738 98.4 F (36.9 C)     Temp Source 07/23/21 1738 Oral     SpO2 07/23/21 1738 97 %     Weight --      Height --      Head Circumference --      Peak Flow --      Pain Score 07/23/21 1737 0     Pain Loc --      Pain Edu? --      Excl. in GC? --    No data found.  Updated Vital Signs BP (!) 163/116 (BP Location: Left Arm)    Pulse 81    Temp 98.4 F (36.9 C) (Oral)    Resp 18    SpO2 97%   Visual Acuity Right Eye Distance:   Left Eye Distance:   Bilateral Distance:    Right Eye Near:   Left Eye Near:    Bilateral Near:     Physical Exam Constitutional:      General: She is  not in acute distress.    Appearance: She is not toxic-appearing.  HENT:     Right Ear: Tympanic membrane normal.     Left Ear: Tympanic membrane and ear canal normal.     Nose: Congestion present.     Mouth/Throat:     Mouth: Mucous membranes are moist.     Pharynx: Oropharyngeal exudate (clear mucus) present. No posterior oropharyngeal erythema.  Eyes:     Extraocular Movements: Extraocular movements intact.     Conjunctiva/sclera: Conjunctivae normal.     Pupils: Pupils are equal, round, and reactive to light.  Cardiovascular:     Rate and Rhythm: Normal rate and regular rhythm.     Heart sounds: No murmur heard. Pulmonary:     Effort: Pulmonary effort is normal.     Breath sounds: No stridor. No wheezing, rhonchi or rales.  Abdominal:     Palpations: Abdomen is soft.     Tenderness: There is no abdominal tenderness.  Musculoskeletal:     Cervical back: Neck supple.  Lymphadenopathy:     Cervical: No cervical adenopathy.  Skin:    Coloration: Skin is not jaundiced or pale.  Neurological:     General: No focal deficit present.     Mental Status: She is alert and oriented to person, place, and time.  Psychiatric:        Behavior: Behavior normal.     UC Treatments / Results  Labs (all labs ordered are listed, but only abnormal results are displayed) Labs Reviewed  POCT URINALYSIS DIPSTICK, ED / UC - Abnormal; Notable for the following components:      Result Value   Ketones, ur TRACE (*)    All other components within normal limits  SARS CORONAVIRUS 2 (TAT 6-24 HRS)    EKG   Radiology No results found.  Procedures Procedures (including critical care time)  Medications Ordered in UC Medications - No data to display  Initial Impression / Assessment and Plan / UC Course  I have reviewed the triage vital signs and the nursing notes.  Pertinent labs & imaging results  that were available during my care of the patient were reviewed by me and considered in my  medical decision making (see chart for details).     Trace ketones in urine. Will treat for poss UTI and c/s urine.  COVID swab at dc. Final Clinical Impressions(s) / UC Diagnoses   Final diagnoses:  Viral upper respiratory tract infection  Cystitis     Discharge Instructions      Nitrofurantoin 100 mg twice daily for 7 days for UTI.  Staff will call you if your covid swab is positive, and you can check your mychart. If pos, then quarantine 5 days from today.     ED Prescriptions     Medication Sig Dispense Auth. Provider   nitrofurantoin, macrocrystal-monohydrate, (MACROBID) 100 MG capsule Take 1 capsule (100 mg total) by mouth 2 (two) times daily for 7 days. 14 capsule Marlinda Mike, Janace Aris, MD      PDMP not reviewed this encounter.   Zenia Resides, MD 07/23/21 [phone]

## 2021-07-24 LAB — SARS CORONAVIRUS 2 (TAT 6-24 HRS): SARS Coronavirus 2: NEGATIVE

## 2021-07-25 ENCOUNTER — Other Ambulatory Visit: Payer: Self-pay | Admitting: Family Medicine

## 2021-07-27 ENCOUNTER — Ambulatory Visit (HOSPITAL_COMMUNITY)
Admission: EM | Admit: 2021-07-27 | Discharge: 2021-07-27 | Disposition: A | Discharge status: Home / Self Care | Payer: Medicaid Other | Attending: Student | Admitting: Student

## 2021-07-27 ENCOUNTER — Encounter (HOSPITAL_COMMUNITY): Payer: Self-pay

## 2021-07-27 ENCOUNTER — Other Ambulatory Visit: Payer: Self-pay

## 2021-07-27 DIAGNOSIS — J069 Acute upper respiratory infection, unspecified: Secondary | ICD-10-CM

## 2021-07-27 DIAGNOSIS — Z9889 Other specified postprocedural states: Secondary | ICD-10-CM

## 2021-07-27 LAB — RESPIRATORY PANEL BY PCR

## 2021-07-27 MED ORDER — FLUTICASONE PROPIONATE 50 MCG/ACT NA SUSP: 2.0000 | Freq: Every day | NASAL | 2 refills | Status: DC

## 2021-07-27 MED ORDER — PREDNISONE 20 MG PO TABS: 20.0000 mg | ORAL_TABLET | Freq: Every day | ORAL | 0 refills | Status: AC

## 2021-07-27 MED ORDER — PROMETHAZINE-DM 6.25-15 MG/5ML PO SYRP: 5.0000 mL | ORAL_SOLUTION | Freq: Four times a day (QID) | ORAL | 0 refills | Status: DC | PRN

## 2021-07-27 NOTE — ED Provider Notes (Signed)
[place]    CSN: [id number] Arrival date & time: 07/27/21  1450      History   Chief Complaint Chief Complaint  Patient presents with   Sore Throat    HPI HESTER SILBERMAN is a 45 y.o. female presenting with viral syndrome x7 days. Medical history noncontributory, denies history asthma. Describes myalgias, subjective chils, hasn't monitored temperature. Nyquil helping somewhat.  Was last seen at our urgent care for this 1 week ago, at that time she was COVID-negative.  States that the symptoms have not improved at all since then, desiring influenza test today.  Denies shortness of breath, chest pain, dizziness, weakness, nausea/vomiting/diarrhea.    HPI  Past Medical History:  Diagnosis Date   Anemia    history   Hypertension    SVD (spontaneous vaginal delivery)    x 3 - 1 set of twins    Patient Active Problem List   Diagnosis Date Noted   Candidiasis of vulva and vagina 08/13/2013   Excessive or frequent menstruation 12/31/2012   Urinary tract infection, site not specified 12/31/2012    Past Surgical History:  Procedure Laterality Date   CESAREAN SECTION     x 1   DILITATION & CURRETTAGE/HYSTROSCOPY WITH HYDROTHERMAL ABLATION N/A 12/18/2013   Procedure: DILATATION & CURETTAGE/HYSTEROSCOPY WITH HYDROTHERMAL ABLATION;  Surgeon: Shelly Bombard, MD;  Location: [facility];  Service: Gynecology;  Laterality: N/A;   TONSILLECTOMY     TUBAL LIGATION Bilateral    WISDOM TOOTH EXTRACTION      OB History     Gravida  5   Para  4   Term  3   Preterm  1   AB  1   Living  5      SAB  1   IAB  0   Ectopic  0   Multiple  1   Live Births  5        Obstetric Comments  Pt had twins with this pregnancy.          Home Medications    Prior to Admission medications   Medication Sig Start Date End Date Taking? Authorizing Provider  fluticasone (FLONASE) 50 MCG/ACT nasal spray Place 2 sprays into both nostrils daily. 07/27/21  Yes Hazel Sams, PA-C  predniSONE (DELTASONE) 20 MG tablet Take 1 tablet (20 mg total) by mouth daily for 5 days. 07/27/21 08/01/21 Yes Hazel Sams, PA-C  promethazine-dextromethorphan (PROMETHAZINE-DM) 6.25-15 MG/5ML syrup Take 5 mLs by mouth 4 (four) times daily as needed for cough. 07/27/21  Yes Hazel Sams, PA-C  erythromycin ophthalmic ointment Place a 1/2 inch ribbon of ointment into the lower eyelid 4 x day for 5 days 06/13/21   Margarita Mail, PA-C  naphazoline-pheniramine (NAPHCON-A) 0.025-0.3 % ophthalmic solution Place 1 drop into both eyes 4 (four) times daily as needed for eye irritation. Patient not taking: No sig reported 08/14/17   Rodell Perna A, PA-C  naproxen (NAPROSYN) 375 MG tablet Take 1 tablet (375 mg total) by mouth 2 (two) times daily with a meal. 06/13/21   Harris, Abigail, PA-C  nitrofurantoin, macrocrystal-monohydrate, (MACROBID) 100 MG capsule Take 1 capsule (100 mg total) by mouth 2 (two) times daily for 7 days. 07/23/21 07/30/21  Barrett Henle, MD  triamcinolone (KENALOG) 0.1 % Apply 1 application topically 2 (two) times daily as needed. 10/20/20   Volney American, PA-C  triamcinolone cream (KENALOG) 0.1 % Apply 1 application topically 2 (two) times daily. 06/13/21  Harris, Abigail, PA-C  lisinopril (PRINIVIL,ZESTRIL) 20 MG tablet TAKE 1 TABLET BY MOUTH EVERY DAY FOR BLOOD PRESSURE Patient not taking: No sig reported 03/26/13 09/08/20  Shelly Bombard, MD    Family History History reviewed. No pertinent family history.  Social History Social History   Tobacco Use   Smoking status: Never   Smokeless tobacco: Never  Substance Use Topics   Alcohol use: Yes    Comment: Socially    Drug use: No     Allergies   Patient has no known allergies.   Review of Systems Review of Systems  Constitutional:  Negative for appetite change, chills and fever.  HENT:  Positive for congestion. Negative for ear pain, rhinorrhea, sinus pressure, sinus pain and sore  throat.   Eyes:  Negative for redness and visual disturbance.  Respiratory:  Positive for cough. Negative for chest tightness, shortness of breath and wheezing.   Cardiovascular:  Negative for chest pain and palpitations.  Gastrointestinal:  Negative for abdominal pain, constipation, diarrhea, nausea and vomiting.  Genitourinary:  Negative for dysuria, frequency and urgency.  Musculoskeletal:  Positive for myalgias.  Neurological:  Negative for dizziness, weakness and headaches.  Psychiatric/Behavioral:  Negative for confusion.   All other systems reviewed and are negative.   Physical Exam Triage Vital Signs ED Triage Vitals  Enc Vitals Group     BP      Pulse      Resp      Temp      Temp src      SpO2      Weight      Height      Head Circumference      Peak Flow      Pain Score      Pain Loc      Pain Edu?      Excl. in [place]?    No data found.  Updated Vital Signs BP (!) 148/96 (BP Location: Right Arm)    Pulse 95    Temp 98.1 F (36.7 C) (Oral)    Resp 17    SpO2 97%   Visual Acuity Right Eye Distance:   Left Eye Distance:   Bilateral Distance:    Right Eye Near:   Left Eye Near:    Bilateral Near:     Physical Exam Vitals reviewed.  Constitutional:      General: She is not in acute distress.    Appearance: Normal appearance. She is not ill-appearing.  HENT:     Head: Normocephalic and atraumatic.     Right Ear: Tympanic membrane, ear canal and external ear normal. No tenderness. No middle ear effusion. There is no impacted cerumen. Tympanic membrane is not perforated, erythematous, retracted or bulging.     Left Ear: Tympanic membrane, ear canal and external ear normal. No tenderness.  No middle ear effusion. There is no impacted cerumen. Tympanic membrane is not perforated, erythematous, retracted or bulging.     Nose: Nose normal. No congestion.     Mouth/Throat:     Mouth: Mucous membranes are moist.     Pharynx: Uvula midline. No oropharyngeal exudate  or posterior oropharyngeal erythema.  Eyes:     Extraocular Movements: Extraocular movements intact.     Pupils: Pupils are equal, round, and reactive to light.  Cardiovascular:     Rate and Rhythm: Normal rate and regular rhythm.     Heart sounds: Normal heart sounds.  Pulmonary:     Effort: Pulmonary effort  is normal.     Breath sounds: Normal breath sounds. No decreased breath sounds, wheezing, rhonchi or rales.  Abdominal:     Palpations: Abdomen is soft.     Tenderness: There is no abdominal tenderness. There is no guarding or rebound.  Lymphadenopathy:     Cervical: No cervical adenopathy.     Right cervical: No superficial cervical adenopathy.    Left cervical: No superficial cervical adenopathy.  Neurological:     General: No focal deficit present.     Mental Status: She is alert and oriented to person, place, and time.  Psychiatric:        Mood and Affect: Mood normal.        Behavior: Behavior normal.        Thought Content: Thought content normal.        Judgment: Judgment normal.     UC Treatments / Results  Labs (all labs ordered are listed, but only abnormal results are displayed) Labs Reviewed  RESPIRATORY PANEL BY PCR    EKG   Radiology No results found.  Procedures Procedures (including critical care time)  Medications Ordered in UC Medications - No data to display  Initial Impression / Assessment and Plan / UC Course  I have reviewed the triage vital signs and the nursing notes.  Pertinent labs & imaging results that were available during my care of the patient were reviewed by me and considered in my medical decision making (see chart for details).     This patient is a very pleasant 45 y.o. year old female presenting with viral URI with cough. Today this pt is afebrile nontachycardic nontachypneic, oxygenating well on room air, no wheezes rhonchi or rales.   Covid negative last visit 07/23/21. Respiratory panel including influenza sent.   She is out of tamiflu window.  Promethazine DM, low-dose prednisone, flonase.   Work note provided. ED return precautions discussed. Patient verbalizes understanding and agreement.      Final Clinical Impressions(s) / UC Diagnoses   Final diagnoses:  Viral URI with cough  History of endometrial ablation     Discharge Instructions      -Prednisone one pill with breakfast x5 days -Promethazine DM cough syrup for congestion/cough. This could make you drowsy, so you may with to take at night before bed. -Flonase nasal steroid: place 2 sprays into both nostrils in the morning and at bedtime for at least 7 days. Continue for longer if this is helping. -Continue over-the-counter medications if they are helping -With a virus, you're typically contagious for 5-7 days, or as long as you're having fevers.         ED Prescriptions     Medication Sig Dispense Auth. Provider   promethazine-dextromethorphan (PROMETHAZINE-DM) 6.25-15 MG/5ML syrup Take 5 mLs by mouth 4 (four) times daily as needed for cough. 118 mL Ignacia Bayley E, PA-C   fluticasone [facility]) 50 MCG/ACT nasal spray Place 2 sprays into both nostrils daily. 15 mL Rhys Martini, PA-C   predniSONE (DELTASONE) 20 MG tablet Take 1 tablet (20 mg total) by mouth daily for 5 days. 5 tablet Rhys Martini, PA-C      PDMP not reviewed this encounter.   Rhys Martini, PA-C 07/27/21 1551

## 2021-07-27 NOTE — Discharge Instructions (Addendum)
-  Prednisone one pill with breakfast x5 days -Promethazine DM cough syrup for congestion/cough. This could make you drowsy, so you may with to take at night before bed. -Flonase nasal steroid: place 2 sprays into both nostrils in the morning and at bedtime for at least 7 days. Continue for longer if this is helping. -Continue over-the-counter medications if they are helping -With a virus, you're typically contagious for 5-7 days, or as long as you're having fevers.

## 2021-07-27 NOTE — ED Triage Notes (Signed)
Pt presents with c/o sore throat, chills, cough x 1 week. States she has body aches and states she was COVID tested last week.

## 2021-08-29 ENCOUNTER — Ambulatory Visit (INDEPENDENT_AMBULATORY_CARE_PROVIDER_SITE_OTHER): Payer: Self-pay

## 2021-08-29 ENCOUNTER — Ambulatory Visit (HOSPITAL_COMMUNITY)
Admission: EM | Admit: 2021-08-29 | Discharge: 2021-08-29 | Disposition: A | Discharge status: Home / Self Care | Payer: Self-pay | Attending: Family Medicine | Admitting: Family Medicine

## 2021-08-29 ENCOUNTER — Encounter (HOSPITAL_COMMUNITY): Payer: Self-pay

## 2021-08-29 ENCOUNTER — Other Ambulatory Visit: Payer: Self-pay

## 2021-08-29 DIAGNOSIS — I10 Essential (primary) hypertension: Secondary | ICD-10-CM

## 2021-08-29 DIAGNOSIS — M25562 Pain in left knee: Secondary | ICD-10-CM

## 2021-08-29 MED ORDER — DICLOFENAC SODIUM 75 MG PO TBEC: 75.0000 mg | DELAYED_RELEASE_TABLET | Freq: Two times a day (BID) | ORAL | 0 refills | Status: DC

## 2021-08-29 NOTE — Discharge Instructions (Signed)
Your blood pressure was noted to be elevated during your visit today. If you are currently taking medication for high blood pressure, please ensure you are taking this as directed. If you do not have a history of high blood pressure and your blood pressure remains persistently elevated, you may need to begin taking a medication at some point. You may return here within the next few days to recheck if unable to see your primary care provider or if you do not have a one.  BP (!) 171/110 (BP Location: Right Arm)    Pulse 77    Temp 98.2 F (36.8 C) (Oral)    Resp 18    SpO2 98%   BP Readings from Last 3 Encounters:  08/29/21 (!) 171/110  07/27/21 (!) 148/96  07/23/21 (!) 160/99

## 2021-08-29 NOTE — ED Triage Notes (Signed)
Pt c/o rt leg pain x 44month after a fall that's worsening in pain. Pt c/o rt side of throat pain x2 days. Pt c/o headache today. States took tylenol last night.

## 2021-08-30 NOTE — ED Provider Notes (Signed)
[facility]   [id number] 08/29/21 Arrival Time: 1745  ASSESSMENT & PLAN:  1. Acute pain of left knee   2. Elevated blood pressure reading in office with diagnosis of hypertension    I have personally viewed the imaging studies ordered this visit. No acute changes/fx of L knee. Likely inflammatory; discussed with pt.    Discharge Instructions      Your blood pressure was noted to be elevated during your visit today. If you are currently taking medication for high blood pressure, please ensure you are taking this as directed. If you do not have a history of high blood pressure and your blood pressure remains persistently elevated, you may need to begin taking a medication at some point. You may return here within the next few days to recheck if unable to see your primary care provider or if you do not have a one.  BP (!) 171/110 (BP Location: Right Arm)    Pulse 77    Temp 98.2 F (36.8 C) (Oral)    Resp 18    SpO2 98%   BP Readings from Last 3 Encounters:  08/29/21 (!) 171/110  07/27/21 (!) 148/96  07/23/21 (!) 160/99      For knee pain, begin trial of: Meds ordered this encounter  Medications   diclofenac (VOLTAREN) 75 MG EC tablet    Sig: Take 1 tablet (75 mg total) by mouth 2 (two) times daily.    Dispense:  14 tablet    Refill:  0   Recommend:  Follow-up Information     Schedule an appointment as soon as possible for a visit  with [facility].   Why: To evaluate your knee. Contact information: [address] [place] [address] [phone]               Also recommend est care with PCP.  Reviewed expectations re: course of current medical issues. Questions answered. Outlined signs and symptoms indicating need for more acute intervention. Patient verbalized understanding. After Visit Summary given.   SUBJECTIVE:  Allison Dennis is a 46 y.o. female who reports L knee pain after falling  one mo ago. Able to bear weight. Pain is intermittent and non-radiating; more posterior/lateral. Tylenol with some relief. No extremity sensation changes or weakness.   Also BP noted to be elevated. No previous tx for HTN. She reports no chest pain on exertion, no dyspnea on exertion, no swelling of ankles, no orthostatic dizziness or lightheadedness, no orthopnea or paroxysmal nocturnal dyspnea, and no palpitations.  Denies symptoms of chest pain, palpations, orthopnea, nocturnal dyspnea, or LE edema.  Social History   Tobacco Use  Smoking Status Never  Smokeless Tobacco Never    OBJECTIVE:  Vitals:   08/29/21 1914  BP: (!) 171/110  Pulse: 77  Resp: 18  Temp: 98.2 F (36.8 C)  TempSrc: Oral  SpO2: 98%    General appearance: alert; no distress Eyes: PERRLA; EOMI HENT: normocephalic; atraumatic Neck: supple Lungs: unlabored Abdomen: soft, non-tender; bowel sounds normal Extremities: no edema; symmetrical with no gross deformities; mild posterior "soreness" of L knee; no swelling/effusion Skin: warm and dry Psychological: alert and cooperative; normal mood and affect   Imaging: DG Knee Complete 4 Views Left  Result Date: 08/29/2021 CLINICAL DATA:  Left knee pain after trauma 1 month ago. EXAM: LEFT KNEE - COMPLETE 4+ VIEW COMPARISON:  None. FINDINGS: No acute or healing fracture. Normal alignment, no dislocation. Mild peripheral spurring  of the patellofemoral medial tibiofemoral compartment. No significant knee joint effusion. No erosion or focal bone abnormality. Unremarkable soft tissues. 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 No acute or healing fracture. Electronically Signed   By: Narda Rutherford M.D.   On: 08/29/2021 19:37    No Known Allergies  Past Medical History:  Diagnosis Date   Anemia    history   Hypertension    SVD (spontaneous vaginal delivery)    x 3 - 1 set of twins   Social History    Socioeconomic History   Marital status: Single    Spouse name: Not on file   Number of children: Not on file   Years of education: Not on file   Highest education level: Not on file  Occupational History   Not on file  Tobacco Use   Smoking status: Never   Smokeless tobacco: Never  Substance and Sexual Activity   Alcohol use: Yes    Comment: Socially    Drug use: No   Sexual activity: Not Currently    Partners: Male    Birth control/protection: Surgical  Other Topics Concern   Not on file  Social History Narrative   Not on file   Social Determinants of Health   Financial Resource Strain: Not on file  Food Insecurity: Not on file  Transportation Needs: Not on file  Physical Activity: Not on file  Stress: Not on file  Social Connections: Not on file  Intimate Partner Violence: Not on file   History reviewed. No pertinent family history. Past Surgical History:  Procedure Laterality Date   CESAREAN SECTION     x 1   DILITATION & CURRETTAGE/HYSTROSCOPY WITH HYDROTHERMAL ABLATION N/A 12/18/2013   Procedure: DILATATION & CURETTAGE/HYSTEROSCOPY WITH HYDROTHERMAL ABLATION;  Surgeon: Brock Bad, MD;  Location: [facility];  Service: Gynecology;  Laterality: N/A;   TONSILLECTOMY     TUBAL LIGATION Bilateral    WISDOM TOOTH EXTRACTION         Mardella Layman, MD 08/30/21 [phone]

## 2021-10-05 ENCOUNTER — Encounter: Payer: Self-pay | Admitting: Family Medicine

## 2021-10-05 ENCOUNTER — Ambulatory Visit: Payer: Self-pay | Attending: Family Medicine | Admitting: Family Medicine

## 2021-10-05 ENCOUNTER — Other Ambulatory Visit: Payer: Self-pay

## 2021-10-05 ENCOUNTER — Other Ambulatory Visit (HOSPITAL_COMMUNITY)
Admission: RE | Admit: 2021-10-05 | Discharge: 2021-10-05 | Disposition: A | Discharge status: Home / Self Care | Payer: Medicaid Other | Source: Ambulatory Visit | Attending: Family Medicine | Admitting: Family Medicine

## 2021-10-05 VITALS — BP 159/103 | HR 84 | Ht 65.0 in | Wt 307.8 lb

## 2021-10-05 DIAGNOSIS — I10 Essential (primary) hypertension: Secondary | ICD-10-CM | POA: Insufficient documentation

## 2021-10-05 DIAGNOSIS — Z1159 Encounter for screening for other viral diseases: Secondary | ICD-10-CM

## 2021-10-05 DIAGNOSIS — N898 Other specified noninflammatory disorders of vagina: Secondary | ICD-10-CM | POA: Insufficient documentation

## 2021-10-05 DIAGNOSIS — Z13228 Encounter for screening for other metabolic disorders: Secondary | ICD-10-CM

## 2021-10-05 MED ORDER — LISINOPRIL-HYDROCHLOROTHIAZIDE 20-12.5 MG PO TABS
1.0000 | ORAL_TABLET | Freq: Every day | ORAL | 6 refills | Status: DC
  Filled 2021-10-05 – 2021-10-13 (×2): qty 30, 30d supply, fill #0
  Filled 2021-11-07 – 2021-11-15 (×2): qty 30, 30d supply, fill #1
  Filled 2021-12-07 – 2022-01-04 (×2): qty 30, 30d supply, fill #2
  Filled 2022-01-30: qty 30, 30d supply, fill #3
  Filled 2022-03-17 – 2022-03-29 (×3): qty 30, 30d supply, fill #4
  Filled 2022-05-12: qty 30, 30d supply, fill #5
  Filled 2022-06-08: qty 30, 30d supply, fill #6

## 2021-10-05 MED ORDER — METRONIDAZOLE 0.75 % VA GEL
VAGINAL | 6 refills | Status: DC
Start: 2021-10-05 — End: 2022-11-10
  Filled 2021-10-05 – 2021-10-13 (×2): qty 70, 7d supply, fill #0
  Filled 2022-01-30: qty 70, 7d supply, fill #1
  Filled 2022-03-28: qty 70, 21d supply, fill #2
  Filled 2022-08-14: qty 70, 7d supply, fill #2
  Filled 2022-09-07: qty 70, 7d supply, fill #3

## 2021-10-05 NOTE — Progress Notes (Signed)
BP elevated in ED, no medication in a while ? ?

## 2021-10-05 NOTE — Progress Notes (Signed)
? ?Subjective:  ?Patient ID: Allison Dennis, female    DOB: 02/26/1976  Age: 46 y.o. MRN: [id number] ? ?CC: New Patient (Initial Visit) ? ? ?HPI ?Allison Dennis is a 46 y.o. year old female with a history of hypertension who presents today to establish care. ?She has not had a PCP in a while. ? ?Interval History: ?Previously on Lisinopril but has not been on it.  States she tolerated lisinopril while she was on it.  Her blood pressure is elevated and was also elevated at an ED visit last month. ? ?She complains of a ''yeast infection or UTI'' and states in the past she was about to be placed on a daily prophylactic medication but she is not sure what it was. ?Her symptoms include vaginal discharge which sometimes associated with itching.  Then she further adds that she thinks it might be BV.  At the moment she currently has a discharge. ?Past Medical History:  ?Diagnosis Date  ? Anemia   ? history  ? Hypertension   ? SVD (spontaneous vaginal delivery)   ? x 3 - 1 set of twins  ? ? ?Past Surgical History:  ?Procedure Laterality Date  ? CESAREAN SECTION    ? x 1  ? DILITATION & CURRETTAGE/HYSTROSCOPY WITH HYDROTHERMAL ABLATION N/A 12/18/2013  ? Procedure: DILATATION & CURETTAGE/HYSTEROSCOPY WITH HYDROTHERMAL ABLATION;  Surgeon: Shelly Bombard, MD;  Location: [facility];  Service: Gynecology;  Laterality: N/A;  ? TONSILLECTOMY    ? TUBAL LIGATION Bilateral   ? WISDOM TOOTH EXTRACTION    ? ? ?No family history on file. ? ?No Known Allergies ? ?Outpatient Medications Prior to Visit  ?Medication Sig Dispense Refill  ? diclofenac (VOLTAREN) 75 MG EC tablet Take 1 tablet (75 mg total) by mouth 2 (two) times daily. (Patient not taking: Reported on 10/05/2021) 14 tablet 0  ? erythromycin ophthalmic ointment Place a 1/2 inch ribbon of ointment into the lower eyelid 4 x day for 5 days (Patient not taking: Reported on 10/05/2021) 3.5 g 0  ? fluticasone (FLONASE) 50 MCG/ACT nasal spray Place 2 sprays into both nostrils daily. (Patient not  taking: Reported on 10/05/2021) 15 mL 2  ? naphazoline-pheniramine (NAPHCON-A) 0.025-0.3 % ophthalmic solution Place 1 drop into both eyes 4 (four) times daily as needed for eye irritation. (Patient not taking: Reported on 12/16/2018) 5 mL 0  ? triamcinolone (KENALOG) 0.1 % Apply 1 application topically 2 (two) times daily as needed. (Patient not taking: Reported on 10/05/2021) 60 g 0  ? triamcinolone cream (KENALOG) 0.1 % Apply 1 application topically 2 (two) times daily. (Patient not taking: Reported on 10/05/2021) 30 g 0  ? ?No facility-administered medications prior to visit.  ? ? ? ?ROS ?Review of Systems  ?Constitutional:  Negative for activity change, appetite change and fatigue.  ?HENT:  Negative for congestion, sinus pressure and sore throat.   ?Eyes:  Negative for visual disturbance.  ?Respiratory:  Negative for cough, chest tightness, shortness of breath and wheezing.   ?Cardiovascular:  Negative for chest pain and palpitations.  ?Gastrointestinal:  Negative for abdominal distention, abdominal pain and constipation.  ?Endocrine: Negative for polydipsia.  ?Genitourinary:  Negative for dysuria and frequency.  ?Musculoskeletal:  Negative for arthralgias and back pain.  ?Skin:  Negative for rash.  ?Neurological:  Negative for tremors, light-headedness and numbness.  ?Hematological:  Does not bruise/bleed easily.  ?Psychiatric/Behavioral:  Negative for agitation and behavioral problems.   ? ?Objective:  ?BP (!) 159/103   Pulse  84   Ht _0  (1.651 m)   Wt (!) 307 lb 12.8 oz (139.6 kg)   SpO2 100%   BMI 51.22 kg/m?  ? ?BP/Weight 10/05/2021 08/29/2021 07/27/2021  ?Systolic BP 101 751 025  ?Diastolic BP 852 778 96  ?Wt. (Lbs) 307.8 - -  ?BMI 51.22 - -  ? ? ? ? ?Physical Exam ?Constitutional:   ?   Appearance: She is well-developed. She is obese.  ?Cardiovascular:  ?   Rate and Rhythm: Normal rate.  ?   Heart sounds: Normal heart sounds. No murmur heard. ?Pulmonary:  ?   Effort: Pulmonary effort is normal.  ?   Breath  sounds: Normal breath sounds. No wheezing or rales.  ?Chest:  ?   Chest wall: No tenderness.  ?Abdominal:  ?   General: Bowel sounds are normal. There is no distension.  ?   Palpations: Abdomen is soft. There is no mass.  ?   Tenderness: There is no abdominal tenderness.  ?Musculoskeletal:     ?   General: Normal range of motion.  ?   Right lower leg: No edema.  ?   Left lower leg: No edema.  ?Neurological:  ?   Mental Status: She is alert and oriented to person, place, and time.  ?Psychiatric:     ?   Mood and Affect: Mood normal.  ? ? ?CMP Latest Ref Rng & Units 12/16/2018 12/15/2013 12/31/2012  ?Glucose 70 - 99 mg/dL 105(H) 92 85  ?BUN 6 - 20 mg/dL _1 ?Creatinine 0.44 - 1.00 mg/dL 0.80 0.68 0.60  ?Sodium 135 - 145 mmol/L 140 144 138  ?Potassium 3.5 - 5.1 mmol/L 4.0 5.3 4.6  ?Chloride 98 - 111 mmol/L 104 108 105  ?CO2 19 - 32 mEq/L - 27 27  ?Calcium 8.4 - 10.5 mg/dL - 9.6 8.9  ?Total Protein 6.0 - 8.3 g/dL - - 7.2  ?Total Bilirubin 0.3 - 1.2 mg/dL - - 0.4  ?Alkaline Phos 39 - 117 U/L - - 103  ?AST 0 - 37 U/L - - 19  ?ALT 0 - 35 U/L - - 13  ? ? ?Lipid Panel  ?No results found for: CHOL, TRIG, HDL, CHOLHDL, VLDL, LDLCALC, LDLDIRECT ? ?CBC ?   ?Component Value Date/Time  ? WBC 5.4 12/16/2018 0826  ? RBC 5.11 12/16/2018 0826  ? HGB 15.3 (H) 12/16/2018 2423  ? HCT 45.0 12/16/2018 0833  ? PLT 285 12/16/2018 0826  ? MCV 88.1 12/16/2018 0826  ? MCH 29.4 12/16/2018 0826  ? MCHC 33.3 12/16/2018 0826  ? RDW 13.2 12/16/2018 0826  ? LYMPHSABS 2.8 12/16/2018 0826  ? MONOABS 0.4 12/16/2018 0826  ? EOSABS 0.1 12/16/2018 0826  ? BASOSABS 0.0 12/16/2018 0826  ? ? ?No results found for: HGBA1C ? ?Assessment & Plan:  ?1. Essential hypertension ?Uncontrolled ?We will place on lisinopril/HCTZ ?Labs ordered today and I will repeat again at her next visit to ensure that her potassium is stable ?Counseled on blood pressure goal of less than 130/80, low-sodium, DASH diet, medication compliance, 150 minutes of moderate intensity exercise  per week. ?Discussed medication compliance, adverse effects. ?- CMP14+EGFR ?- LP+Non-HDL Cholesterol ?- lisinopril-hydrochlorothiazide (ZESTORETIC) 20-12.5 MG tablet; Take 1 tablet by mouth daily.  Dispense: 30 tablet; Refill: 6 ? ?2. Vaginal discharge ?Due to recurrence of bacterial vaginosis I will place on prophylactic treatment ?We will send off cervicovaginal ancillary test and if positive will treat acute infection after which she will proceed with prophylactic treatment. ?-  Cervicovaginal ancillary only ?- metroNIDAZOLE (METROGEL VAGINAL) 0.75 % vaginal gel; Insert 1 applicator vaginally twice a week for bacterial vaginosis prophylaxis  Dispense: 70 g; Refill: 6 ? ?3. Need for hepatitis C screening test ?- HCV Ab w Reflex to Quant PCR ? ?4. Screening for metabolic disorder ?- Hemoglobin A1c ? ? ? ?Meds ordered this encounter  ?Medications  ? metroNIDAZOLE (METROGEL VAGINAL) 0.75 % vaginal gel  ?  Sig: Insert 1 applicator vaginally twice a week for bacterial vaginosis prophylaxis  ?  Dispense:  70 g  ?  Refill:  6  ? lisinopril-hydrochlorothiazide (ZESTORETIC) 20-12.5 MG tablet  ?  Sig: Take 1 tablet by mouth daily.  ?  Dispense:  30 tablet  ?  Refill:  6  ? ? ?Follow-up: Return in about 1 month (around 11/05/2021) for Preventive Health Exam.  ? ? ? ? ? ?Charlott Rakes, MD, FAAFP. ?[place] ?[place], [place] ?[phone]   ?10/05/2021, 11:18 AM ?

## 2021-10-05 NOTE — Patient Instructions (Signed)
Managing Your Hypertension Hypertension, also called high blood pressure, is when the force of the blood pressing against the walls of the arteries is too strong. Arteries are blood vessels that carry blood from your heart throughout your body. Hypertension forces the heart to work harder to pump blood and may cause the arteries to become narrow or stiff. Understanding blood pressure readings Your personal target blood pressure may vary depending on your medical conditions, your age, and other factors. A blood pressure reading includes a higher number over a lower number. Ideally, your blood pressure should be below 120/80. You should know that: The first, or top, number is called the systolic pressure. It is a measure of the pressure in your arteries as your heart beats. The second, or bottom number, is called the diastolic pressure. It is a measure of the pressure in your arteries as the heart relaxes. Blood pressure is classified into four stages. Based on your blood pressure reading, your health care provider may use the following stages to determine what type of treatment you need, if any. Systolic pressure and diastolic pressure are measured in a unit called mmHg. Normal Systolic pressure: below 120. Diastolic pressure: below 80. Elevated Systolic pressure: 120-129. Diastolic pressure: below 80. Hypertension stage 1 Systolic pressure: 130-139. Diastolic pressure: 80-89. Hypertension stage 2 Systolic pressure: 140 or above. Diastolic pressure: 90 or above. How can this condition affect me? Managing your hypertension is an important responsibility. Over time, hypertension can damage the arteries and decrease blood flow to important parts of the body, including the brain, heart, and kidneys. Having untreated or uncontrolled hypertension can lead to: A heart attack. A stroke. A weakened blood vessel (aneurysm). Heart failure. Kidney damage. Eye damage. Metabolic syndrome. Memory and  concentration problems. Vascular dementia. What actions can I take to manage this condition? Hypertension can be managed by making lifestyle changes and possibly by taking medicines. Your health care provider will help you make a plan to bring your blood pressure within a normal range. Nutrition  Eat a diet that is high in fiber and potassium, and low in salt (sodium), added sugar, and fat. An example eating plan is called the Dietary Approaches to Stop Hypertension (DASH) diet. To eat this way: Eat plenty of fresh fruits and vegetables. Try to fill one-half of your plate at each meal with fruits and vegetables. Eat whole grains, such as whole-wheat pasta, brown rice, or whole-grain bread. Fill about one-fourth of your plate with whole grains. Eat low-fat dairy products. Avoid fatty cuts of meat, processed or cured meats, and poultry with skin. Fill about one-fourth of your plate with lean proteins such as fish, chicken without skin, beans, eggs, and tofu. Avoid pre-made and processed foods. These tend to be higher in sodium, added sugar, and fat. Reduce your daily sodium intake. Most people with hypertension should eat less than 1,500 mg of sodium a day. Lifestyle  Work with your health care provider to maintain a healthy body weight or to lose weight. Ask what an ideal weight is for you. Get at least 30 minutes of exercise that causes your heart to beat faster (aerobic exercise) most days of the week. Activities may include walking, swimming, or biking. Include exercise to strengthen your muscles (resistance exercise), such as weight lifting, as part of your weekly exercise routine. Try to do these types of exercises for 30 minutes at least 3 days a week. Do not use any products that contain nicotine or tobacco, such as cigarettes, e-cigarettes,   and chewing tobacco. If you need help quitting, ask your health care provider. Control any long-term (chronic) conditions you have, such as high  cholesterol or diabetes. Identify your sources of stress and find ways to manage stress. This may include meditation, deep breathing, or making time for fun activities. Alcohol use Do not drink alcohol if: Your health care provider tells you not to drink. You are pregnant, may be pregnant, or are planning to become pregnant. If you drink alcohol: Limit how much you use to: 0-1 drink a day for women. 0-2 drinks a day for men. Be aware of how much alcohol is in your drink. In the [place] one drink equals one 12 oz bottle of beer (355 mL), one 5 oz glass of wine (148 mL), or one 1 oz glass of hard liquor (44 mL). Medicines Your health care provider may prescribe medicine if lifestyle changes are not enough to get your blood pressure under control and if: Your systolic blood pressure is 130 or higher. Your diastolic blood pressure is 80 or higher. Take medicines only as told by your health care provider. Follow the directions carefully. Blood pressure medicines must be taken as told by your health care provider. The medicine does not work as well when you skip doses. Skipping doses also puts you at risk for problems. Monitoring Before you monitor your blood pressure: Do not smoke, drink caffeinated beverages, or exercise within 30 minutes before taking a measurement. Use the bathroom and empty your bladder (urinate). Sit quietly for at least 5 minutes before taking measurements. Monitor your blood pressure at home as told by your health care provider. To do this: Sit with your back straight and supported. Place your feet flat on the floor. Do not cross your legs. Support your arm on a flat surface, such as a table. Make sure your upper arm is at heart level. Each time you measure, take two or three readings one minute apart and record the results. You may also need to have your blood pressure checked regularly by your health care provider. General information Talk with your health care  provider about your diet, exercise habits, and other lifestyle factors that may be contributing to hypertension. Review all the medicines you take with your health care provider because there may be side effects or interactions. Keep all visits as told by your health care provider. Your health care provider can help you create and adjust your plan for managing your high blood pressure. Where to find more information National Heart, Lung, and Blood Institute: www.nhlbi.nih.gov American Heart Association: www.heart.org Contact a health care provider if: You think you are having a reaction to medicines you have taken. You have repeated (recurrent) headaches. You feel dizzy. You have swelling in your ankles. You have trouble with your vision. Get help right away if: You develop a severe headache or confusion. You have unusual weakness or numbness, or you feel faint. You have severe pain in your chest or abdomen. You vomit repeatedly. You have trouble breathing. These symptoms may represent a serious problem that is an emergency. Do not wait to see if the symptoms will go away. Get medical help right away. Call your local emergency services (911 in the [place] Do not drive yourself to the hospital. Summary Hypertension is when the force of blood pumping through your arteries is too strong. If this condition is not controlled, it may put you at risk for serious complications. Your personal target blood pressure may vary depending on   your medical conditions, your age, and other factors. For most people, a normal blood pressure is less than 120/80. Hypertension is managed by lifestyle changes, medicines, or both. Lifestyle changes to help manage hypertension include losing weight, eating a healthy, low-sodium diet, exercising more, stopping smoking, and limiting alcohol. This information is not intended to replace advice given to you by your health care provider. Make sure you discuss any questions  you have with your health care provider. Document Revised: 08/10/2019 Document Reviewed: 06/23/2019 Elsevier Patient Education  2022 [employer] Inc.  

## 2021-10-06 ENCOUNTER — Other Ambulatory Visit: Payer: Self-pay | Admitting: Family Medicine

## 2021-10-06 DIAGNOSIS — R748 Abnormal levels of other serum enzymes: Secondary | ICD-10-CM

## 2021-10-06 LAB — CERVICOVAGINAL ANCILLARY ONLY
Bacterial Vaginitis (gardnerella): NEGATIVE
Candida Glabrata: NEGATIVE
Candida Vaginitis: NEGATIVE
Chlamydia: NEGATIVE
Comment: NEGATIVE
Comment: NEGATIVE
Comment: NEGATIVE
Comment: NEGATIVE
Comment: NEGATIVE
Comment: NORMAL
Neisseria Gonorrhea: NEGATIVE
Trichomonas: NEGATIVE

## 2021-10-06 LAB — CMP14+EGFR
ALT: 21 IU/L (ref 0–32)
AST: 23 IU/L (ref 0–40)
Albumin/Globulin Ratio: 1.5 (ref 1.2–2.2)
Albumin: 4.6 g/dL (ref 3.8–4.8)
Alkaline Phosphatase: 182 IU/L — ABNORMAL HIGH (ref 44–121)
BUN/Creatinine Ratio: 15 (ref 9–23)
BUN: 9 mg/dL (ref 6–24)
Bilirubin Total: 0.4 mg/dL (ref 0.0–1.2)
CO2: 26 mmol/L (ref 20–29)
Calcium: 9.8 mg/dL (ref 8.7–10.2)
Chloride: 107 mmol/L — ABNORMAL HIGH (ref 96–106)
Creatinine, Ser: 0.62 mg/dL (ref 0.57–1.00)
Globulin, Total: 3.1 g/dL (ref 1.5–4.5)
Glucose: 99 mg/dL (ref 70–99)
Potassium: 4.6 mmol/L (ref 3.5–5.2)
Sodium: 144 mmol/L (ref 134–144)
Total Protein: 7.7 g/dL (ref 6.0–8.5)
eGFR: 113 mL/min/{1.73_m2} (ref >59)

## 2021-10-06 LAB — HEMOGLOBIN A1C
Est. average glucose Bld gHb Est-mCnc: 123 mg/dL
Hgb A1c MFr Bld: 5.9 % — ABNORMAL HIGH (ref 4.8–5.6)

## 2021-10-06 LAB — LP+NON-HDL CHOLESTEROL
Cholesterol, Total: 232 mg/dL — ABNORMAL HIGH (ref 100–199)
HDL: 85 mg/dL (ref >39)
LDL Chol Calc (NIH): 136 mg/dL — ABNORMAL HIGH (ref 0–99)
Total Non-HDL-Chol (LDL+VLDL): 147 mg/dL — ABNORMAL HIGH (ref 0–129)
Triglycerides: 63 mg/dL (ref 0–149)
VLDL Cholesterol Cal: 11 mg/dL (ref 5–40)

## 2021-10-06 LAB — HCV AB W REFLEX TO QUANT PCR: HCV Ab: NONREACTIVE

## 2021-10-06 LAB — HCV INTERPRETATION

## 2021-10-10 LAB — GAMMA GT: GGT: 64 IU/L — ABNORMAL HIGH (ref 0–60)

## 2021-10-10 LAB — SPECIMEN STATUS REPORT

## 2021-10-12 ENCOUNTER — Other Ambulatory Visit: Payer: Self-pay

## 2021-10-13 ENCOUNTER — Other Ambulatory Visit: Payer: Self-pay

## 2021-11-07 ENCOUNTER — Other Ambulatory Visit: Payer: Self-pay

## 2021-11-10 ENCOUNTER — Encounter (HOSPITAL_COMMUNITY): Payer: Self-pay | Admitting: Emergency Medicine

## 2021-11-10 ENCOUNTER — Ambulatory Visit (HOSPITAL_COMMUNITY)
Admission: EM | Admit: 2021-11-10 | Discharge: 2021-11-10 | Disposition: A | Discharge status: Home / Self Care | Payer: Medicaid Other | Attending: Internal Medicine | Admitting: Internal Medicine

## 2021-11-10 ENCOUNTER — Other Ambulatory Visit: Payer: Self-pay

## 2021-11-10 DIAGNOSIS — Z20822 Contact with and (suspected) exposure to covid-19: Secondary | ICD-10-CM | POA: Insufficient documentation

## 2021-11-10 DIAGNOSIS — J029 Acute pharyngitis, unspecified: Secondary | ICD-10-CM | POA: Insufficient documentation

## 2021-11-10 DIAGNOSIS — J069 Acute upper respiratory infection, unspecified: Secondary | ICD-10-CM | POA: Insufficient documentation

## 2021-11-10 LAB — POCT RAPID STREP A, ED / UC: Streptococcus, Group A Screen (Direct): NEGATIVE

## 2021-11-10 MED ORDER — FLUTICASONE PROPIONATE 50 MCG/ACT NA SUSP: 1.0000 | Freq: Every day | NASAL | 0 refills | Status: DC

## 2021-11-10 MED ORDER — BENZONATATE 100 MG PO CAPS: 100.0000 mg | ORAL_CAPSULE | Freq: Three times a day (TID) | ORAL | 0 refills | Status: DC | PRN

## 2021-11-10 NOTE — ED Provider Notes (Signed)
?[facility] ? ? ? ?CSN: [id number] ?Arrival date & time: 11/10/21  1928 ? ? ?  ? ?History   ?Chief Complaint ?Chief Complaint  ?Patient presents with  ? Headache  ? Cough  ? Sore Throat  ? Nasal Congestion  ? Chills  ? ? ?HPI ?Allison Dennis is a 46 y.o. female.  ? ?Patient presents with headache, nasal congestion, cough, sore throat, chills that have been present for 2 days.  Denies any known fevers at home.  Denies any known sick contacts.  Denies chest pain, shortness of breath, ear pain, nausea, vomiting, diarrhea, abdominal pain.  Patient reports they have been taking "cough syrup" with minimal improvement in symptoms. ? ? ?Headache ?Cough ?Sore Throat ? ? ?Past Medical History:  ?Diagnosis Date  ? Anemia   ? history  ? Hypertension   ? SVD (spontaneous vaginal delivery)   ? x 3 - 1 set of twins  ? ? ?Patient Active Problem List  ? Diagnosis Date Noted  ? Essential hypertension   ? Candidiasis of vulva and vagina 08/13/2013  ? Excessive or frequent menstruation 12/31/2012  ? Urinary tract infection, site not specified 12/31/2012  ? ? ?Past Surgical History:  ?Procedure Laterality Date  ? CESAREAN SECTION    ? x 1  ? DILITATION & CURRETTAGE/HYSTROSCOPY WITH HYDROTHERMAL ABLATION N/A 12/18/2013  ? Procedure: DILATATION & CURETTAGE/HYSTEROSCOPY WITH HYDROTHERMAL ABLATION;  Surgeon: Brock Bad, MD;  Location: [facility];  Service: Gynecology;  Laterality: N/A;  ? TONSILLECTOMY    ? TUBAL LIGATION Bilateral   ? WISDOM TOOTH EXTRACTION    ? ? ?OB History   ? ? Gravida  ?5  ? Para  ?4  ? Term  ?3  ? Preterm  ?1  ? AB  ?1  ? Living  ?5  ?  ? ? SAB  ?1  ? IAB  ?0  ? Ectopic  ?0  ? Multiple  ?1  ? Live Births  ?5  ?   ?  ? Obstetric Comments  ?Pt had twins with this pregnancy.  ?  ? ?  ? ? ? ?Home Medications   ? ?Prior to Admission medications   ?Medication Sig Start Date End Date Taking? Authorizing Provider  ?benzonatate (TESSALON) 100 MG capsule Take 1 capsule (100 mg total) by mouth every 8 (eight) hours  as needed for cough. 11/10/21  Yes Gustavus Bryant, FNP  ?fluticasone (FLONASE) 50 MCG/ACT nasal spray Place 1 spray into both nostrils daily for 3 days. 11/10/21 11/13/21 Yes Gustavus Bryant, FNP  ?lisinopril-hydrochlorothiazide (ZESTORETIC) 20-12.5 MG tablet Take 1 tablet by mouth daily. 10/05/21   Hoy Register, MD  ?metroNIDAZOLE (METROGEL VAGINAL) 0.75 % vaginal gel Insert 1 applicator vaginally twice a week for bacterial vaginosis prophylaxis 10/05/21   Hoy Register, MD  ?lisinopril (PRINIVIL,ZESTRIL) 20 MG tablet TAKE 1 TABLET BY MOUTH EVERY DAY FOR BLOOD PRESSURE ?Patient not taking: No sig reported 03/26/13 09/08/20  Brock Bad, MD  ? ? ?Family History ?History reviewed. No pertinent family history. ? ?Social History ?Social History  ? ?Tobacco Use  ? Smoking status: Never  ? Smokeless tobacco: Never  ?Substance Use Topics  ? Alcohol use: Yes  ?  Comment: Socially   ? Drug use: No  ? ? ? ?Allergies   ?Patient has no known allergies. ? ? ?Review of Systems ?Review of Systems ?Per HPI ? ?Physical Exam ?Triage Vital Signs ?ED Triage Vitals  ?Enc Vitals Group  ?  BP 11/10/21 1945 (!) 153/96  ?   Pulse Rate 11/10/21 1945 75  ?   Resp 11/10/21 1945 18  ?   Temp 11/10/21 1945 98.8 ?F (37.1 ?C)  ?   Temp Source 11/10/21 1945 Oral  ?   SpO2 11/10/21 1945 97 %  ?   Weight 11/10/21 1943 (!) 307 lb 12.2 oz (139.6 kg)  ?   Height 11/10/21 1943 5\' 5"  (1.651 m)  ?   Head Circumference --   ?   Peak Flow --   ?   Pain Score 11/10/21 1943 7  ?   Pain Loc --   ?   Pain Edu? --   ?   Excl. in GC? --   ? ?No data found. ? ?Updated Vital Signs ?BP (!) 153/96 (BP Location: Right Wrist)   Pulse 75   Temp 98.8 ?F (37.1 ?C) (Oral)   Resp 18   Ht 5\' 5"  (1.651 m)   Wt (!) 307 lb 12.2 oz (139.6 kg)   SpO2 97%   BMI 51.21 kg/m?  ? ?Visual Acuity ?Right Eye Distance:   ?Left Eye Distance:   ?Bilateral Distance:   ? ?Right Eye Near:   ?Left Eye Near:    ?Bilateral Near:    ? ?Physical Exam ?Constitutional:   ?   General: She is not  in acute distress. ?   Appearance: Normal appearance. She is not toxic-appearing or diaphoretic.  ?HENT:  ?   Head: Normocephalic and atraumatic.  ?   Right Ear: Tympanic membrane and ear canal normal.  ?   Left Ear: Tympanic membrane and ear canal normal.  ?   Nose: Congestion present.  ?   Mouth/Throat:  ?   Mouth: Mucous membranes are moist.  ?   Pharynx: Posterior oropharyngeal erythema present.  ?Eyes:  ?   Extraocular Movements: Extraocular movements intact.  ?   Conjunctiva/sclera: Conjunctivae normal.  ?   Pupils: Pupils are equal, round, and reactive to light.  ?Cardiovascular:  ?   Rate and Rhythm: Normal rate and regular rhythm.  ?   Pulses: Normal pulses.  ?   Heart sounds: Normal heart sounds.  ?Pulmonary:  ?   Effort: Pulmonary effort is normal. No respiratory distress.  ?   Breath sounds: Normal breath sounds. No stridor. No wheezing, rhonchi or rales.  ?Abdominal:  ?   General: Abdomen is flat. Bowel sounds are normal.  ?   Palpations: Abdomen is soft.  ?Musculoskeletal:     ?   General: Normal range of motion.  ?   Cervical back: Normal range of motion.  ?Skin: ?   General: Skin is warm and dry.  ?Neurological:  ?   General: No focal deficit present.  ?   Mental Status: She is alert and oriented to person, place, and time. Mental status is at baseline.  ?Psychiatric:     ?   Mood and Affect: Mood normal.     ?   Behavior: Behavior normal.  ? ? ? ?UC Treatments / Results  ?Labs ?(all labs ordered are listed, but only abnormal results are displayed) ?Labs Reviewed  ?CULTURE, GROUP A STREP [facility])  ?SARS CORONAVIRUS 2 (TAT 6-24 HRS)  ?POCT RAPID STREP A, ED / UC  ? ? ?EKG ? ? ?Radiology ?No results found. ? ?Procedures ?Procedures (including critical care time) ? ?Medications Ordered in UC ?Medications - No data to display ? ?Initial Impression / Assessment and Plan / UC Course  ?I  have reviewed the triage vital signs and the nursing notes. ? ?Pertinent labs & imaging results that were available during  my care of the patient were reviewed by me and considered in my medical decision making (see chart for details). ? ?  ? ?Patient presents with symptoms likely from a viral upper respiratory infection. Differential includes bacterial pneumonia, sinusitis, allergic rhinitis, COVID-19, flu. Do not suspect underlying cardiopulmonary process. Symptoms seem unlikely related to ACS, CHF or COPD exacerbations, pneumonia, pneumothorax. Patient is nontoxic appearing and not in need of emergent medical intervention.  Rapid strep was negative.  Throat culture and COVID test pending. ? ?Recommended symptom control with over the counter medications.  Patient sent prescriptions. ? ?Return if symptoms fail to improve in 1-2 weeks or you develop shortness of breath, chest pain, severe headache. Patient states understanding and is agreeable. ? ?Discharged with PCP followup.  ?Final Clinical Impressions(s) / UC Diagnoses  ? ?Final diagnoses:  ?Viral URI with cough  ?Sore throat  ? ? ? ?Discharge Instructions   ? ?  ?It appears that you have a viral illness that should run its course and self resolve in the next few days with symptomatic treatment.  You have been prescribed 2 medications to help alleviate symptoms.  Please follow-up if symptoms persist or worsen.  Rapid strep was negative.  Throat culture and COVID test are pending.  We will call if they are positive. ? ? ? ?ED Prescriptions   ? ? Medication Sig Dispense Auth. Provider  ? fluticasone (FLONASE) 50 MCG/ACT nasal spray Place 1 spray into both nostrils daily for 3 days. 16 g Ervin KnackMound, Elesha Thedford E, [place]  ? benzonatate (TESSALON) 100 MG capsule Take 1 capsule (100 mg total) by mouth every 8 (eight) hours as needed for cough. 21 capsule Ervin KnackMound, Kurt Azimi E, [place]  ? ?  ? ?PDMP not reviewed this encounter. ?  ?Gustavus BryantMound, Terryn Rosenkranz E, [place] ?11/10/21 2009 ? ?

## 2021-11-10 NOTE — ED Triage Notes (Signed)
Pt reports headache, nasal congestion, cough, sore throat and chills x 2 days. States have been taking cough syrup.  ?

## 2021-11-10 NOTE — Discharge Instructions (Signed)
It appears that you have a viral illness that should run its course and self resolve in the next few days with symptomatic treatment.  You have been prescribed 2 medications to help alleviate symptoms.  Please follow-up if symptoms persist or worsen.  Rapid strep was negative.  Throat culture and COVID test are pending.  We will call if they are positive. ?

## 2021-11-11 LAB — SARS CORONAVIRUS 2 (TAT 6-24 HRS): SARS Coronavirus 2: NEGATIVE

## 2021-11-13 LAB — CULTURE, GROUP A STREP (THRC)

## 2021-11-14 ENCOUNTER — Other Ambulatory Visit: Payer: Self-pay

## 2021-11-15 ENCOUNTER — Other Ambulatory Visit: Payer: Self-pay

## 2021-11-28 ENCOUNTER — Ambulatory Visit: Payer: Self-pay | Attending: Family Medicine | Admitting: Family Medicine

## 2021-11-28 ENCOUNTER — Other Ambulatory Visit: Payer: Self-pay

## 2021-11-28 ENCOUNTER — Encounter: Payer: Self-pay | Admitting: Family Medicine

## 2021-11-28 ENCOUNTER — Other Ambulatory Visit (HOSPITAL_COMMUNITY)
Admission: RE | Admit: 2021-11-28 | Discharge: 2021-11-28 | Disposition: A | Discharge status: Home / Self Care | Payer: Medicaid Other | Source: Ambulatory Visit | Attending: Family Medicine | Admitting: Family Medicine

## 2021-11-28 VITALS — BP 170/119 | HR 72 | Ht 65.0 in | Wt 316.2 lb

## 2021-11-28 DIAGNOSIS — Z124 Encounter for screening for malignant neoplasm of cervix: Secondary | ICD-10-CM | POA: Diagnosis not present

## 2021-11-28 DIAGNOSIS — Z6841 Body Mass Index (BMI) 40.0 and over, adult: Secondary | ICD-10-CM | POA: Insufficient documentation

## 2021-11-28 DIAGNOSIS — Z0000 Encounter for general adult medical examination without abnormal findings: Secondary | ICD-10-CM | POA: Insufficient documentation

## 2021-11-28 DIAGNOSIS — Z113 Encounter for screening for infections with a predominantly sexual mode of transmission: Secondary | ICD-10-CM | POA: Insufficient documentation

## 2021-11-28 DIAGNOSIS — Z0001 Encounter for general adult medical examination with abnormal findings: Secondary | ICD-10-CM

## 2021-11-28 DIAGNOSIS — Z79899 Other long term (current) drug therapy: Secondary | ICD-10-CM | POA: Insufficient documentation

## 2021-11-28 DIAGNOSIS — I10 Essential (primary) hypertension: Secondary | ICD-10-CM | POA: Insufficient documentation

## 2021-11-28 DIAGNOSIS — L409 Psoriasis, unspecified: Secondary | ICD-10-CM | POA: Insufficient documentation

## 2021-11-28 DIAGNOSIS — Z1231 Encounter for screening mammogram for malignant neoplasm of breast: Secondary | ICD-10-CM | POA: Insufficient documentation

## 2021-11-28 MED ORDER — CLOBETASOL PROPIONATE 0.05 % EX CREA
1.0000 "application " | TOPICAL_CREAM | Freq: Two times a day (BID) | CUTANEOUS | 1 refills | Status: DC
  Filled 2021-11-28: qty 45, 25d supply, fill #0
  Filled 2022-01-30: qty 45, 25d supply, fill #1

## 2021-11-28 NOTE — Patient Instructions (Signed)

## 2021-11-28 NOTE — Progress Notes (Signed)
? ?Subjective:  ?Patient ID: Allison Dennis, female    DOB: June 14, 1976  Age: 46 y.o. MRN: [id number] ? ?CC: Annual Exam and Gynecologic Exam ? ? ?HPI ?Allison Dennis is a 46 y.o. year old female with a history of morbid obesity, hypertension who presents today for a gynecological exam. ?She is due for Pap smear and mammogram. ? ?Interval History: ? ?Bp is elevated and she has not had her medications in 1 week due to financial constraints.  At her last visit BP was elevated but she had stated she had been out of her medications as well. ? ?She complains of psoriatic lesions on her face and behind her left ear would like a cream for this. ?Past Medical History:  ?Diagnosis Date  ? Anemia   ? history  ? Hypertension   ? SVD (spontaneous vaginal delivery)   ? x 3 - 1 set of twins  ? ? ?Past Surgical History:  ?Procedure Laterality Date  ? CESAREAN SECTION    ? x 1  ? DILITATION & CURRETTAGE/HYSTROSCOPY WITH HYDROTHERMAL ABLATION N/A 12/18/2013  ? Procedure: DILATATION & CURETTAGE/HYSTEROSCOPY WITH HYDROTHERMAL ABLATION;  Surgeon: Shelly Bombard, MD;  Location: [facility];  Service: Gynecology;  Laterality: N/A;  ? TONSILLECTOMY    ? TUBAL LIGATION Bilateral   ? WISDOM TOOTH EXTRACTION    ? ? ?No family history on file. ? ?Social History  ? ?Socioeconomic History  ? Marital status: Single  ?  Spouse name: Not on file  ? Number of children: Not on file  ? Years of education: Not on file  ? Highest education level: Not on file  ?Occupational History  ? Not on file  ?Tobacco Use  ? Smoking status: Never  ? Smokeless tobacco: Never  ?Substance and Sexual Activity  ? Alcohol use: Yes  ?  Comment: Socially   ? Drug use: No  ? Sexual activity: Not Currently  ?  Partners: Male  ?  Birth control/protection: Surgical  ?Other Topics Concern  ? Not on file  ?Social History Narrative  ? Not on file  ? ?Social Determinants of Health  ? ?Financial Resource Strain: Not on file  ?Food Insecurity: Not on file  ?Transportation Needs: Not on  file  ?Physical Activity: Not on file  ?Stress: Not on file  ?Social Connections: Not on file  ? ? ?No Known Allergies ? ?Outpatient Medications Prior to Visit  ?Medication Sig Dispense Refill  ? lisinopril-hydrochlorothiazide (ZESTORETIC) 20-12.5 MG tablet Take 1 tablet by mouth daily. 30 tablet 6  ? benzonatate (TESSALON) 100 MG capsule Take 1 capsule (100 mg total) by mouth every 8 (eight) hours as needed for cough. (Patient not taking: Reported on 11/28/2021) 21 capsule 0  ? fluticasone (FLONASE) 50 MCG/ACT nasal spray Place 1 spray into both nostrils daily for 3 days. 16 g 0  ? metroNIDAZOLE (METROGEL VAGINAL) 0.75 % vaginal gel Insert 1 applicator vaginally twice a week for bacterial vaginosis prophylaxis (Patient not taking: Reported on 11/28/2021) 70 g 6  ? ?No facility-administered medications prior to visit.  ? ? ? ?ROS ?Review of Systems  ?Constitutional:  Negative for activity change, appetite change and fatigue.  ?HENT:  Negative for congestion, sinus pressure and sore throat.   ?Eyes:  Negative for visual disturbance.  ?Respiratory:  Negative for cough, chest tightness, shortness of breath and wheezing.   ?Cardiovascular:  Negative for chest pain and palpitations.  ?Gastrointestinal:  Negative for abdominal distention, abdominal pain and constipation.  ?Endocrine:  Negative for polydipsia.  ?Genitourinary:  Negative for dysuria and frequency.  ?Musculoskeletal:  Negative for arthralgias and back pain.  ?Skin:  Positive for rash.  ?Neurological:  Negative for tremors, light-headedness and numbness.  ?Hematological:  Does not bruise/bleed easily.  ?Psychiatric/Behavioral:  Negative for agitation and behavioral problems.   ? ?Objective:  ?BP (!) 170/119   Pulse 72   Ht _0  (1.651 m)   Wt (!) 316 lb 3.2 oz (143.4 kg)   SpO2 100%   BMI 52.62 kg/m?  ? ? ?  11/28/2021  ?  3:08 PM 11/10/2021  ?  7:45 PM 11/10/2021  ?  7:43 PM  ?BP/Weight  ?Systolic BP 263 785   ?Diastolic BP 885 96   ?Wt. (Lbs) 316.2  307.76   ?BMI 52.62 kg/m2  51.21 kg/m2  ? ? ? ? ?Physical Exam ?Exam conducted with a chaperone present.  ?Constitutional:   ?   General: She is not in acute distress. ?   Appearance: She is well-developed. She is not diaphoretic.  ?HENT:  ?   Head: Normocephalic.  ?   Right Ear: External ear normal.  ?   Left Ear: External ear normal.  ?   Nose: Nose normal.  ?Eyes:  ?   Conjunctiva/sclera: Conjunctivae normal.  ?   Pupils: Pupils are equal, round, and reactive to light.  ?Neck:  ?   Vascular: No JVD.  ?Cardiovascular:  ?   Rate and Rhythm: Normal rate and regular rhythm.  ?   Heart sounds: Normal heart sounds. No murmur heard. ?  No gallop.  ?Pulmonary:  ?   Effort: Pulmonary effort is normal. No respiratory distress.  ?   Breath sounds: Normal breath sounds. No wheezing or rales.  ?Chest:  ?   Chest wall: No tenderness.  ?Breasts: ?   Right: Normal. No mass, nipple discharge or tenderness.  ?   Left: Normal. No mass, nipple discharge or tenderness.  ?Abdominal:  ?   General: Bowel sounds are normal. There is no distension.  ?   Palpations: Abdomen is soft. There is no mass.  ?   Tenderness: There is no abdominal tenderness.  ?   Hernia: There is no hernia in the left inguinal area or right inguinal area.  ?Genitourinary: ?   General: Normal vulva.  ?   Pubic Area: No rash.   ?   Labia:     ?   Right: No rash.     ?   Left: No rash.   ?   Vagina: Normal.  ?   Cervix: Normal.  ?   Uterus: Normal.   ?   Adnexa: Right adnexa normal and left adnexa normal.    ?   Right: No tenderness.      ?   Left: No tenderness.    ?Musculoskeletal:     ?   General: No tenderness. Normal range of motion.  ?   Cervical back: Normal range of motion. No tenderness.  ?Lymphadenopathy:  ?   Upper Body:  ?   Right upper body: No supraclavicular or axillary adenopathy.  ?   Left upper body: No supraclavicular or axillary adenopathy.  ?Skin: ?   General: Skin is warm and dry.  ?Neurological:  ?   Mental Status: She is alert and oriented to person,  place, and time.  ?   Deep Tendon Reflexes: Reflexes are normal and symmetric.  ? ? ? ?  Latest Ref Rng & Units 10/05/2021  ?  10:28 AM 12/16/2018  ?  8:33 AM 12/15/2013  ?  4:10 PM  ?CMP  ?Glucose 70 - 99 mg/dL 99   105   92    ?BUN 6 - 24 mg/dL _0 ?Creatinine 0.57 - 1.00 mg/dL 0.62   0.80   0.68    ?Sodium 134 - 144 mmol/L 144   140   144    ?Potassium 3.5 - 5.2 mmol/L 4.6   4.0   5.3    ?Chloride 96 - 106 mmol/L 107   104   108    ?CO2 20 - 29 mmol/L 26    27    ?Calcium 8.7 - 10.2 mg/dL 9.8    9.6    ?Total Protein 6.0 - 8.5 g/dL 7.7      ?Total Bilirubin 0.0 - 1.2 mg/dL 0.4      ?Alkaline Phos 44 - 121 IU/L 182      ?AST 0 - 40 IU/L 23      ?ALT 0 - 32 IU/L 21      ? ? ?Lipid Panel  ?   ?Component Value Date/Time  ? CHOL 232 (H) 10/05/2021 1028  ? TRIG 63 10/05/2021 1028  ? HDL 85 10/05/2021 1028  ? LDLCALC 136 (H) 10/05/2021 1028  ? ? ?CBC ?   ?Component Value Date/Time  ? WBC 5.4 12/16/2018 0826  ? RBC 5.11 12/16/2018 0826  ? HGB 15.3 (H) 12/16/2018 2103  ? HCT 45.0 12/16/2018 0833  ? PLT 285 12/16/2018 0826  ? MCV 88.1 12/16/2018 0826  ? MCH 29.4 12/16/2018 0826  ? MCHC 33.3 12/16/2018 0826  ? RDW 13.2 12/16/2018 0826  ? LYMPHSABS 2.8 12/16/2018 0826  ? MONOABS 0.4 12/16/2018 0826  ? EOSABS 0.1 12/16/2018 0826  ? BASOSABS 0.0 12/16/2018 0826  ? ? ?Lab Results  ?Component Value Date  ? HGBA1C 5.9 (H) 10/05/2021  ? ? ?The 10-year ASCVD risk score (Arnett DK, et al., 2019) is: 3.8% ?  Values used to calculate the score: ?    Age: 53 years ?    Sex: Female ?    Is Non-Hispanic African American: Yes ?    Diabetic: No ?    Tobacco smoker: No ?    Systolic Blood Pressure: 128 mmHg ?    Is BP treated: Yes ?    HDL Cholesterol: 85 mg/dL ?    Total Cholesterol: 232 mg/dL ? ?Assessment & Plan:  ?1. Annual physical exam ?Counseled on 150 minutes of exercise per week, healthy eating (including decreased daily intake of saturated fats, cholesterol, added sugars, sodium), STI prevention, routine healthcare  maintenance. ?- CMP14+EGFR ? ?2. Screening for STD (sexually transmitted disease) ?- Cervicovaginal ancillary only ? ?3. Screening for cervical cancer ?- Cytology - PAP ? ?4. Psoriasis ?Advised to obtain OTC hydrocortison

## 2021-11-28 NOTE — Progress Notes (Signed)
CPE

## 2021-11-29 LAB — CMP14+EGFR
ALT: 30 IU/L (ref 0–32)
AST: 26 IU/L (ref 0–40)
Albumin/Globulin Ratio: 1.3 (ref 1.2–2.2)
Albumin: 4.2 g/dL (ref 3.8–4.8)
Alkaline Phosphatase: 169 IU/L — ABNORMAL HIGH (ref 44–121)
BUN/Creatinine Ratio: 9 (ref 9–23)
BUN: 6 mg/dL (ref 6–24)
Bilirubin Total: 0.5 mg/dL (ref 0.0–1.2)
CO2: 25 mmol/L (ref 20–29)
Calcium: 9.9 mg/dL (ref 8.7–10.2)
Chloride: 107 mmol/L — ABNORMAL HIGH (ref 96–106)
Creatinine, Ser: 0.65 mg/dL (ref 0.57–1.00)
Globulin, Total: 3.3 g/dL (ref 1.5–4.5)
Glucose: 88 mg/dL (ref 70–99)
Potassium: 4.8 mmol/L (ref 3.5–5.2)
Sodium: 144 mmol/L (ref 134–144)
Total Protein: 7.5 g/dL (ref 6.0–8.5)
eGFR: 112 mL/min/{1.73_m2} (ref >59)

## 2021-11-29 LAB — CERVICOVAGINAL ANCILLARY ONLY
Bacterial Vaginitis (gardnerella): NEGATIVE
Candida Glabrata: NEGATIVE
Candida Vaginitis: NEGATIVE
Chlamydia: NEGATIVE
Comment: NEGATIVE
Comment: NEGATIVE
Comment: NEGATIVE
Comment: NEGATIVE
Comment: NEGATIVE
Comment: NORMAL
Neisseria Gonorrhea: NEGATIVE
Trichomonas: NEGATIVE

## 2021-12-01 LAB — CYTOLOGY - PAP
Adequacy: ABSENT
Comment: NEGATIVE
Diagnosis: NEGATIVE
High risk HPV: NEGATIVE

## 2021-12-07 ENCOUNTER — Other Ambulatory Visit: Payer: Self-pay

## 2021-12-14 ENCOUNTER — Other Ambulatory Visit: Payer: Self-pay

## 2022-01-04 ENCOUNTER — Other Ambulatory Visit: Payer: Self-pay

## 2022-01-17 ENCOUNTER — Telehealth: Payer: Self-pay | Admitting: Pharmacist

## 2022-01-17 NOTE — Telephone Encounter (Signed)
Patient attempted to be outreached by Mary Baker Turner, PharmD Candidate on 01/17/22 to discuss hypertension. Voicemail was full.    Catie T. Irena Gaydos, PharmD, BCACP [facility] [phone]  

## 2022-01-30 ENCOUNTER — Other Ambulatory Visit: Payer: Self-pay

## 2022-02-02 ENCOUNTER — Other Ambulatory Visit: Payer: Self-pay

## 2022-02-27 ENCOUNTER — Encounter: Payer: Self-pay | Admitting: Family Medicine

## 2022-02-27 ENCOUNTER — Ambulatory Visit: Payer: Self-pay | Attending: Family Medicine | Admitting: Family Medicine

## 2022-02-27 VITALS — BP 120/80 | HR 98 | Temp 98.1°F | Ht 65.0 in | Wt 313.0 lb

## 2022-02-27 DIAGNOSIS — S6010XA Contusion of unspecified finger with damage to nail, initial encounter: Secondary | ICD-10-CM

## 2022-02-27 DIAGNOSIS — I10 Essential (primary) hypertension: Secondary | ICD-10-CM

## 2022-02-27 NOTE — Patient Instructions (Signed)

## 2022-02-27 NOTE — Progress Notes (Signed)
Subjective:  Patient ID: Allison Dennis, female    DOB: [birth year]  Age: 47 y.o. MRN: [id number]  CC: Hypertension   HPI Allison Dennis is a 46 y.o. year old female with a history of hypertension seen for an office visit.  Interval History: Her blood pressure is controlled and she endorses adhering to her medication but not to a low-sodium diet.  She does not exercise at all as she is tired when she returns home from work at 7 PM.  She injured her right index finger at work and now has a sore spot which is dark.  She denies presence of foreign body in it. Past Medical History:  Diagnosis Date   Anemia    history   Hypertension    SVD (spontaneous vaginal delivery)    x 3 - 1 set of twins    Past Surgical History:  Procedure Laterality Date   CESAREAN SECTION     x 1   DILITATION & CURRETTAGE/HYSTROSCOPY WITH HYDROTHERMAL ABLATION N/A 12/18/2013   Procedure: DILATATION & CURETTAGE/HYSTEROSCOPY WITH HYDROTHERMAL ABLATION;  Surgeon: Brock Bad, MD;  Location: [facility];  Service: Gynecology;  Laterality: N/A;   TONSILLECTOMY     TUBAL LIGATION Bilateral    WISDOM TOOTH EXTRACTION      History reviewed. No pertinent family history.  Social History   Socioeconomic History   Marital status: Single    Spouse name: Not on file   Number of children: Not on file   Years of education: Not on file   Highest education level: Not on file  Occupational History   Not on file  Tobacco Use   Smoking status: Never   Smokeless tobacco: Never  Substance and Sexual Activity   Alcohol use: Yes    Comment: Socially    Drug use: No   Sexual activity: Not Currently    Partners: Male    Birth control/protection: Surgical  Other Topics Concern   Not on file  Social History Narrative   Not on file   Social Determinants of Health   Financial Resource Strain: Not on file  Food Insecurity: Not on file  Transportation Needs: Not on file  Physical Activity: Not on file  Stress: Not  on file  Social Connections: Not on file    No Known Allergies  Outpatient Medications Prior to Visit  Medication Sig Dispense Refill   clobetasol cream (TEMOVATE) 0.05 % Apply topically 2 (two) times daily. 45 g 1   lisinopril-hydrochlorothiazide (ZESTORETIC) 20-12.5 MG tablet Take 1 tablet by mouth daily. 30 tablet 6   metroNIDAZOLE (METROGEL VAGINAL) 0.75 % vaginal gel Insert 1 applicator vaginally twice a week for bacterial vaginosis prophylaxis 70 g 6   fluticasone (FLONASE) 50 MCG/ACT nasal spray Place 1 spray into both nostrils daily for 3 days. 16 g 0   benzonatate (TESSALON) 100 MG capsule Take 1 capsule (100 mg total) by mouth every 8 (eight) hours as needed for cough. (Patient not taking: Reported on 11/28/2021) 21 capsule 0   No facility-administered medications prior to visit.     ROS Review of Systems  Constitutional:  Negative for activity change and appetite change.  HENT:  Negative for sinus pressure and sore throat.   Respiratory:  Negative for chest tightness, shortness of breath and wheezing.   Cardiovascular:  Negative for chest pain and palpitations.  Gastrointestinal:  Negative for abdominal distention, abdominal pain and constipation.  Genitourinary: Negative.   Musculoskeletal: Negative.   Skin:  Positive for rash.  Psychiatric/Behavioral:  Negative for behavioral problems and dysphoric mood.     Objective:  BP 120/80   Pulse 98   Temp 98.1 F (36.7 C) (Oral)   Ht 5\' 5"  (1.651 m)   Wt (!) 313 lb (142 kg)   SpO2 95%   BMI 52.09 kg/m      02/27/2022    3:17 PM 11/28/2021    3:08 PM 11/10/2021    7:45 PM  BP/Weight  Systolic BP 120 170 153  Diastolic BP 80 119 96  Wt. (Lbs) 313 316.2   BMI 52.09 kg/m2 52.62 kg/m2       Physical Exam Constitutional:      Appearance: She is well-developed.  Cardiovascular:     Rate and Rhythm: Normal rate.     Heart sounds: Normal heart sounds. No murmur heard. Pulmonary:     Effort: Pulmonary effort is  normal.     Breath sounds: Normal breath sounds. No wheezing or rales.  Chest:     Chest wall: No tenderness.  Abdominal:     General: Bowel sounds are normal. There is no distension.     Palpations: Abdomen is soft. There is no mass.     Tenderness: There is no abdominal tenderness.  Musculoskeletal:        General: Normal range of motion.     Right lower leg: No edema.     Left lower leg: No edema.  Skin:    Comments: Hyperpigmented tender lesion in pulp of right index finger  Neurological:     Mental Status: She is alert and oriented to person, place, and time.  Psychiatric:        Mood and Affect: Mood normal.        Latest Ref Rng & Units 11/28/2021    3:38 PM 10/05/2021   10:28 AM 12/16/2018    8:33 AM  CMP  Glucose 70 - 99 mg/dL 88  99  02/15/2019   BUN 6 - 24 mg/dL 6  9  7    Creatinine 0.57 - 1.00 mg/dL 878   6.76   Sodium 134 - 144 mmol/L 144  144  140   Potassium 3.5 - 5.2 mmol/L 4.8  4.6  4.0   Chloride 96 - 106 mmol/L 107  107  104   CO2 20 - 29 mmol/L 25  26    Calcium 8.7 - 10.2 mg/dL 9.9  9.8    Total Protein 6.0 - 8.5 g/dL 7.5  7.7    Total Bilirubin 0.0 - 1.2 mg/dL 0.5  0.4    Alkaline Phos 44 - 121 IU/L 169  182    AST 0 - 40 IU/L 26  23    ALT 0 - 32 IU/L 30  21      Lipid Panel     Component Value Date/Time   CHOL 232 (H) 10/05/2021 1028   TRIG 63 10/05/2021 1028   HDL 85 10/05/2021 1028   LDLCALC 136 (H) 10/05/2021 1028    CBC    Component Value Date/Time   WBC 5.4 12/16/2018 0826   RBC 5.11 12/16/2018 0826   HGB 15.3 (H) 12/16/2018 0833   HCT 45.0 12/16/2018 0833   PLT 285 12/16/2018 0826   MCV 88.1 12/16/2018 0826   MCH 29.4 12/16/2018 0826   MCHC 33.3 12/16/2018 0826   RDW 13.2 12/16/2018 0826   LYMPHSABS 2.8 12/16/2018 0826   MONOABS 0.4 12/16/2018 0826   EOSABS 0.1 12/16/2018  8366   BASOSABS 0.0 12/16/2018 0826    Lab Results  Component Value Date   HGBA1C 5.9 (H) 10/05/2021    Assessment & Plan:  1. Essential  hypertension Controlled Counseled on blood pressure goal of less than 130/80, low-sodium, DASH diet, medication compliance, 150 minutes of moderate intensity exercise per week. Discussed medication compliance, adverse effects.  2. Morbid obesity (HCC) Counseled on beginning a regular walking regimen and continue caloric restriction She has questions about GLP-1 RA however due to lack of medical coverage and given she does not have diabetes she does not meet criteria Also discussed options of medical weight management and surgical weight management - Amb Ref to Medical Weight Management  3. Subungual hematoma of finger, initial encounter No evidence of foreign body present Advised to apply ice Patient reassured   No orders of the defined types were placed in this encounter.   Follow-up: Return in about 6 months (around 08/30/2022) for Chronic medical conditions.       Hoy Register, MD, FAAFP. [facility] [place], [place] [phone]   02/27/2022, 3:52 PM

## 2022-03-19 ENCOUNTER — Other Ambulatory Visit: Payer: Self-pay

## 2022-03-23 ENCOUNTER — Other Ambulatory Visit: Payer: Self-pay

## 2022-03-28 ENCOUNTER — Other Ambulatory Visit: Payer: Self-pay | Admitting: Family Medicine

## 2022-03-28 DIAGNOSIS — L409 Psoriasis, unspecified: Secondary | ICD-10-CM

## 2022-03-29 ENCOUNTER — Other Ambulatory Visit (HOSPITAL_COMMUNITY): Payer: Self-pay

## 2022-03-29 ENCOUNTER — Other Ambulatory Visit: Payer: Self-pay

## 2022-03-29 MED ORDER — CLOBETASOL PROPIONATE 0.05 % EX CREA
1.0000 | TOPICAL_CREAM | Freq: Two times a day (BID) | CUTANEOUS | 1 refills | Status: DC
  Filled 2022-03-29: qty 45, 30d supply, fill #0
  Filled 2022-08-14: qty 60, 25d supply, fill #0
  Filled 2022-08-14: qty 45, 30d supply, fill #0
  Filled 2022-09-07 – 2022-11-10 (×2): qty 60, 25d supply, fill #1

## 2022-03-29 NOTE — Telephone Encounter (Signed)
Requested medication (s) are due for refill today - yes  Requested medication (s) are on the active medication list -yes  Future visit scheduled -no  Last refill: 11/28/21 45g 1RF  Notes to clinic: non delegated Rx  Requested Prescriptions  Pending Prescriptions Disp Refills   clobetasol cream (TEMOVATE) 0.05 % 45 g 1    Sig: Apply topically 2 (two) times daily.     Not Delegated - Dermatology:  Corticosteroids Failed - 03/28/2022  7:20 PM      Failed - This refill cannot be delegated      Passed - Valid encounter within last 12 months    Recent Outpatient Visits           1 month ago Essential hypertension   [facility] Hoy Register, MD   4 months ago Annual physical exam   [facility] Hoy Register, MD   5 months ago Vaginal discharge   [facility] Hoy Register, MD       Future Appointments             In 5 months Hoy Register, MD [facility]               Requested Prescriptions  Pending Prescriptions Disp Refills   clobetasol cream (TEMOVATE) 0.05 % 45 g 1    Sig: Apply topically 2 (two) times daily.     Not Delegated - Dermatology:  Corticosteroids Failed - 03/28/2022  7:20 PM      Failed - This refill cannot be delegated      Passed - Valid encounter within last 12 months    Recent Outpatient Visits           1 month ago Essential hypertension   [facility] [place], Odette Horns, MD   4 months ago Annual physical exam   [facility] [place], Odette Horns, MD   5 months ago Vaginal discharge   [facility] Hoy Register, MD       Future Appointments             In 5 months Hoy Register, MD [facility]

## 2022-03-30 ENCOUNTER — Other Ambulatory Visit (HOSPITAL_COMMUNITY): Payer: Self-pay

## 2022-03-31 ENCOUNTER — Other Ambulatory Visit (HOSPITAL_COMMUNITY): Payer: Self-pay

## 2022-04-13 ENCOUNTER — Other Ambulatory Visit (HOSPITAL_COMMUNITY): Payer: Self-pay

## 2022-05-09 ENCOUNTER — Other Ambulatory Visit (HOSPITAL_COMMUNITY): Payer: Self-pay

## 2022-05-11 ENCOUNTER — Other Ambulatory Visit (HOSPITAL_COMMUNITY): Payer: Self-pay

## 2022-05-14 ENCOUNTER — Other Ambulatory Visit: Payer: Self-pay

## 2022-06-08 ENCOUNTER — Other Ambulatory Visit: Payer: Self-pay

## 2022-06-11 ENCOUNTER — Other Ambulatory Visit: Payer: Self-pay

## 2022-07-08 ENCOUNTER — Other Ambulatory Visit: Payer: Self-pay | Admitting: Family Medicine

## 2022-07-08 DIAGNOSIS — I10 Essential (primary) hypertension: Secondary | ICD-10-CM

## 2022-07-09 ENCOUNTER — Other Ambulatory Visit: Payer: Self-pay

## 2022-07-09 MED ORDER — LISINOPRIL-HYDROCHLOROTHIAZIDE 20-12.5 MG PO TABS
1.0000 | ORAL_TABLET | Freq: Every day | ORAL | 1 refills | Status: DC
  Filled 2022-07-09 – 2022-08-14 (×3): qty 30, 30d supply, fill #0
  Filled 2022-09-07: qty 30, 30d supply, fill #1

## 2022-07-09 NOTE — Telephone Encounter (Signed)
Requested medication (s) are due for refill today: yes  Requested medication (s) are on the active medication list: yes  Last refill:  10/05/21 #30/6  Future visit scheduled: yes 09/03/22  Notes to clinic:  Unable to refill per protocol due to failed labs, no updated results.      Requested Prescriptions  Pending Prescriptions Disp Refills   lisinopril-hydrochlorothiazide (ZESTORETIC) 20-12.5 MG tablet 30 tablet 6    Sig: Take 1 tablet by mouth daily.     Cardiovascular:  ACEI + Diuretic Combos Failed - 07/08/2022 12:51 AM      Failed - Na in normal range and within 180 days    Sodium  Date Value Ref Range Status  11/28/2021 144 134 - 144 mmol/L Final         Failed - K in normal range and within 180 days    Potassium  Date Value Ref Range Status  11/28/2021 4.8 3.5 - 5.2 mmol/L Final         Failed - Cr in normal range and within 180 days    Creat  Date Value Ref Range Status  12/31/2012 0.60 0.50 - 1.10 mg/dL Final   Creatinine, Ser  Date Value Ref Range Status  11/28/2021 0.65 0.57 - 1.00 mg/dL Final         Failed - eGFR is 30 or above and within 180 days    GFR calc Af Amer  Date Value Ref Range Status  12/15/2013 >90 >90 mL/min Final    Comment:    (NOTE) The eGFR has been calculated using the CKD EPI equation. This calculation has not been validated in all clinical situations. eGFR's persistently <90 mL/min signify possible Chronic Kidney Disease.   GFR calc non Af Amer  Date Value Ref Range Status  12/15/2013 >90 >90 mL/min Final   eGFR  Date Value Ref Range Status  11/28/2021 112 >59 mL/min/1.73 Final         Passed - Patient is not pregnant      Passed - Last BP in normal range    BP Readings from Last 1 Encounters:  02/27/22 120/80         Passed - Valid encounter within last 6 months    Recent Outpatient Visits           4 months ago Essential hypertension   [place], Charlane Ferretti, MD   7 months ago  Annual physical exam   [place], Charlane Ferretti, MD   9 months ago Vaginal discharge   [place], MD       Future Appointments             In 1 month Charlott Rakes, MD [place]

## 2022-07-13 ENCOUNTER — Other Ambulatory Visit: Payer: Self-pay

## 2022-07-26 ENCOUNTER — Other Ambulatory Visit: Payer: Self-pay

## 2022-08-14 ENCOUNTER — Other Ambulatory Visit: Payer: Self-pay

## 2022-08-14 ENCOUNTER — Other Ambulatory Visit (HOSPITAL_COMMUNITY): Payer: Self-pay

## 2022-09-03 ENCOUNTER — Ambulatory Visit: Payer: Self-pay | Admitting: Family Medicine

## 2022-09-10 ENCOUNTER — Other Ambulatory Visit: Payer: Self-pay

## 2022-09-11 ENCOUNTER — Other Ambulatory Visit: Payer: Self-pay

## 2022-09-14 ENCOUNTER — Other Ambulatory Visit: Payer: Self-pay

## 2022-10-02 ENCOUNTER — Other Ambulatory Visit: Payer: Self-pay

## 2022-10-02 ENCOUNTER — Other Ambulatory Visit: Payer: Self-pay | Admitting: Family Medicine

## 2022-10-02 ENCOUNTER — Ambulatory Visit
Admission: EM | Admit: 2022-10-02 | Discharge: 2022-10-02 | Disposition: A | Discharge status: Home / Self Care | Payer: Medicaid Other | Attending: Family Medicine | Admitting: Family Medicine

## 2022-10-02 ENCOUNTER — Encounter: Payer: Self-pay | Admitting: Emergency Medicine

## 2022-10-02 DIAGNOSIS — R070 Pain in throat: Secondary | ICD-10-CM | POA: Diagnosis not present

## 2022-10-02 DIAGNOSIS — J069 Acute upper respiratory infection, unspecified: Secondary | ICD-10-CM | POA: Insufficient documentation

## 2022-10-02 DIAGNOSIS — Z1152 Encounter for screening for COVID-19: Secondary | ICD-10-CM | POA: Insufficient documentation

## 2022-10-02 DIAGNOSIS — R112 Nausea with vomiting, unspecified: Secondary | ICD-10-CM

## 2022-10-02 DIAGNOSIS — I10 Essential (primary) hypertension: Secondary | ICD-10-CM

## 2022-10-02 LAB — POCT RAPID STREP A (OFFICE): Rapid Strep A Screen: NEGATIVE

## 2022-10-02 MED ORDER — BENZONATATE 100 MG PO CAPS
100.0000 mg | ORAL_CAPSULE | Freq: Three times a day (TID) | ORAL | 0 refills | Status: DC | PRN
  Filled 2022-10-02: qty 21, 7d supply, fill #0

## 2022-10-02 MED ORDER — ONDANSETRON 4 MG PO TBDP
4.0000 mg | ORAL_TABLET | Freq: Three times a day (TID) | ORAL | 0 refills | Status: DC | PRN
  Filled 2022-10-02: qty 10, 4d supply, fill #0

## 2022-10-02 NOTE — ED Provider Notes (Signed)
[name]    CSN: [id number] Arrival date & time: 10/02/22  1448      History   Chief Complaint Chief Complaint  Patient presents with   Cough   Nausea    HPI Allison Dennis is a 47 y.o. female.    Cough  Here for sore throat and cough and congestion.  Symptoms began on February 23, 4 days ago.  Today she started having vomiting and is thrown up 4 times.  She has been aching and feeling tired and has felt short of breath some.  No fever noted at home  She states she always has a little white coating on her tongue, but it is more pronounced now  He has not heard any wheezing and has never had a history of asthma.  She does take medication for hypertension Past Medical History:  Diagnosis Date   Anemia    history   Hypertension    SVD (spontaneous vaginal delivery)    x 3 - 1 set of twins    Patient Active Problem List   Diagnosis Date Noted   Essential hypertension    Candidiasis of vulva and vagina 08/13/2013   Excessive or frequent menstruation 12/31/2012   Urinary tract infection, site not specified 12/31/2012    Past Surgical History:  Procedure Laterality Date   CESAREAN SECTION     x 1   DILITATION & CURRETTAGE/HYSTROSCOPY WITH HYDROTHERMAL ABLATION N/A 12/18/2013   Procedure: DILATATION & CURETTAGE/HYSTEROSCOPY WITH HYDROTHERMAL ABLATION;  Surgeon: Shelly Bombard, MD;  Location: [facility];  Service: Gynecology;  Laterality: N/A;   TONSILLECTOMY     TUBAL LIGATION Bilateral    WISDOM TOOTH EXTRACTION      OB History     Gravida  5   Para  4   Term  3   Preterm  1   AB  1   Living  5      SAB  1   IAB  0   Ectopic  0   Multiple  1   Live Births  5        Obstetric Comments  Pt had twins with this pregnancy.          Home Medications    Prior to Admission medications   Medication Sig Start Date End Date Taking? Authorizing Provider  benzonatate (TESSALON) 100 MG capsule Take 1 capsule (100 mg total) by  mouth 3 (three) times daily as needed for cough. 10/02/22  Yes Barrett Henle, MD  ondansetron (ZOFRAN-ODT) 4 MG disintegrating tablet Take 1 tablet (4 mg total) by mouth every 8 (eight) hours as needed for nausea or vomiting. 10/02/22  Yes Maranda Marte, Gwenlyn Perking, MD  clobetasol cream (TEMOVATE) 0.05 % Apply topically 2 (two) times daily. 03/29/22   Charlott Rakes, MD  fluticasone (FLONASE) 50 MCG/ACT nasal spray Place 1 spray into both nostrils daily for 3 days. 11/10/21 11/13/21  Teodora Medici, FNP  lisinopril-hydrochlorothiazide (ZESTORETIC) 20-12.5 MG tablet Take 1 tablet by mouth daily. 07/09/22   Charlott Rakes, MD  metroNIDAZOLE (METROGEL) 0.75 % vaginal gel Insert 1 applicator vaginally twice a week for bacterial vaginosis prophylaxis 10/05/21   Charlott Rakes, MD  lisinopril (PRINIVIL,ZESTRIL) 20 MG tablet TAKE 1 TABLET BY MOUTH EVERY DAY FOR BLOOD PRESSURE Patient not taking: No sig reported 03/26/13 09/08/20  Shelly Bombard, MD    Family History History reviewed. No pertinent family history.  Social History Social History   Tobacco Use  Smoking status: Never   Smokeless tobacco: Never  Substance Use Topics   Alcohol use: Yes    Comment: Socially    Drug use: No     Allergies   Patient has no known allergies.   Review of Systems Review of Systems  Respiratory:  Positive for cough.      Physical Exam Triage Vital Signs ED Triage Vitals  Enc Vitals Group     BP 10/02/22 1514 (!) 164/94     Pulse Rate 10/02/22 1514 95     Resp 10/02/22 1514 18     Temp 10/02/22 1514 98.3 F (36.8 C)     Temp Source 10/02/22 1514 Oral     SpO2 10/02/22 1514 97 %     Weight --      Height --      Head Circumference --      Peak Flow --      Pain Score 10/02/22 1515 5     Pain Loc --      Pain Edu? --      Excl. in [place]? --    No data found.  Updated Vital Signs BP (!) 164/94 (BP Location: Left Arm)   Pulse 95   Temp 98.3 F (36.8 C) (Oral)   Resp 18   SpO2 97%   Visual  Acuity Right Eye Distance:   Left Eye Distance:   Bilateral Distance:    Right Eye Near:   Left Eye Near:    Bilateral Near:     Physical Exam Vitals reviewed.  Constitutional:      General: She is not in acute distress.    Appearance: She is not ill-appearing, toxic-appearing or diaphoretic.  HENT:     Right Ear: Tympanic membrane and ear canal normal.     Left Ear: Tympanic membrane and ear canal normal.     Nose: Nose normal.     Mouth/Throat:     Mouth: Mucous membranes are moist.     Comments: There is erythema of posterior oropharynx and white mucus.  The tongue is coated white and appears to be geographic Eyes:     Extraocular Movements: Extraocular movements intact.     Conjunctiva/sclera: Conjunctivae normal.     Pupils: Pupils are equal, round, and reactive to light.  Cardiovascular:     Rate and Rhythm: Normal rate and regular rhythm.     Heart sounds: No murmur heard. Pulmonary:     Effort: Pulmonary effort is normal. No respiratory distress.     Breath sounds: No stridor. No wheezing, rhonchi or rales.  Musculoskeletal:     Cervical back: Neck supple.  Lymphadenopathy:     Cervical: No cervical adenopathy.  Skin:    Capillary Refill: Capillary refill takes less than 2 seconds.     Coloration: Skin is not jaundiced or pale.  Neurological:     General: No focal deficit present.     Mental Status: She is alert and oriented to person, place, and time.  Psychiatric:        Behavior: Behavior normal.      UC Treatments / Results  Labs (all labs ordered are listed, but only abnormal results are displayed) Labs Reviewed  SARS CORONAVIRUS 2 (TAT 6-24 HRS)  CULTURE, GROUP A STREP [facility])  POCT RAPID STREP A (OFFICE)    EKG   Radiology No results found.  Procedures Procedures (including critical care time)  Medications Ordered in UC Medications - No data to display  Initial  Impression / Assessment and Plan / UC Course  I have reviewed the triage  vital signs and the nursing notes.  Pertinent labs & imaging results that were available during my care of the patient were reviewed by me and considered in my medical decision making (see chart for details).       Last EGFR in 2023 was 112 Rapid strep test is negative, and throat culture is sent.  If positive staff will call her and treat per protocol.  She is swabbed for COVID, and if positive she is a candidate for Paxlovid, due to her elevated BMI and history of hypertension.  Tessalon Perles are sent to help her cough, and Zofran is sent to help her nausea. Final Clinical Impressions(s) / UC Diagnoses   Final diagnoses:  Viral URI  Throat pain  Nausea and vomiting, unspecified vomiting type     Discharge Instructions      Your strep test is negative.  Culture of the throat will be sent, and staff will notify you if that is in turn positive.   You have been swabbed for COVID, and the test will result in the next 24 hours. Our staff will call you if positive. If the COVID test is positive, you should quarantine for 5 days from the start of your symptoms.  On days 6-10 from the start of your illness, you should wear a mask if out in public.  Take benzonatate 100 mg, 1 tab every 8 hours as needed for cough.  Ondansetron dissolved in the mouth every 8 hours as needed for nausea or vomiting. Clear liquids and bland things to eat. Avoid acidic foods like lemon/lime/orange/tomato.       ED Prescriptions     Medication Sig Dispense Auth. Provider   benzonatate (TESSALON) 100 MG capsule Take 1 capsule (100 mg total) by mouth 3 (three) times daily as needed for cough. 21 capsule Barrett Henle, MD   ondansetron (ZOFRAN-ODT) 4 MG disintegrating tablet Take 1 tablet (4 mg total) by mouth every 8 (eight) hours as needed for nausea or vomiting. 10 tablet Windy Carina Gwenlyn Perking, MD      PDMP not reviewed this encounter.   Barrett Henle, MD 10/02/22 [phone]

## 2022-10-02 NOTE — ED Triage Notes (Signed)
Pt sts cough, N/V, sore throat and chills x 5 days

## 2022-10-02 NOTE — Discharge Instructions (Signed)
Your strep test is negative.  Culture of the throat will be sent, and staff will notify you if that is in turn positive.   You have been swabbed for COVID, and the test will result in the next 24 hours. Our staff will call you if positive. If the COVID test is positive, you should quarantine for 5 days from the start of your symptoms.  On days 6-10 from the start of your illness, you should wear a mask if out in public.  Take benzonatate 100 mg, 1 tab every 8 hours as needed for cough.  Ondansetron dissolved in the mouth every 8 hours as needed for nausea or vomiting. Clear liquids and bland things to eat. Avoid acidic foods like lemon/lime/orange/tomato.

## 2022-10-03 LAB — SARS CORONAVIRUS 2 (TAT 6-24 HRS): SARS Coronavirus 2: NEGATIVE

## 2022-10-03 NOTE — Telephone Encounter (Signed)
Requested medication (s) are due for refill today: yes  Requested medication (s) are on the active medication list: yes  Last refill:  07/09/22 #30 1 RF  Future visit scheduled: yes  Notes to clinic:  overdue lab results-    Requested Prescriptions  Pending Prescriptions Disp Refills   lisinopril-hydrochlorothiazide (ZESTORETIC) 20-12.5 MG tablet 30 tablet 1    Sig: Take 1 tablet by mouth daily.     Cardiovascular:  ACEI + Diuretic Combos Failed - 10/02/2022  7:12 PM      Failed - Na in normal range and within 180 days    Sodium  Date Value Ref Range Status  11/28/2021 144 134 - 144 mmol/L Final         Failed - K in normal range and within 180 days    Potassium  Date Value Ref Range Status  11/28/2021 4.8 3.5 - 5.2 mmol/L Final         Failed - Cr in normal range and within 180 days    Creat  Date Value Ref Range Status  12/31/2012 0.60 0.50 - 1.10 mg/dL Final   Creatinine, Ser  Date Value Ref Range Status  11/28/2021 0.65 0.57 - 1.00 mg/dL Final         Failed - eGFR is 30 or above and within 180 days    GFR calc Af Amer  Date Value Ref Range Status  12/15/2013 >90 >90 mL/min Final    Comment:    (NOTE) The eGFR has been calculated using the CKD EPI equation. This calculation has not been validated in all clinical situations. eGFR's persistently <90 mL/min signify possible Chronic Kidney Disease.   GFR calc non Af Amer  Date Value Ref Range Status  12/15/2013 >90 >90 mL/min Final   eGFR  Date Value Ref Range Status  11/28/2021 112 >59 mL/min/1.73 Final         Failed - Last BP in normal range    BP Readings from Last 1 Encounters:  10/02/22 (!) 164/94         Failed - Valid encounter within last 6 months    Recent Outpatient Visits           7 months ago Essential hypertension   [place], MD   10 months ago Annual physical exam   [place],  MD   12 months ago Vaginal discharge   [place], MD       Future Appointments             In 1 month Charlott Rakes, MD [place] - Patient is not pregnant

## 2022-10-05 ENCOUNTER — Other Ambulatory Visit: Payer: Self-pay

## 2022-10-05 LAB — CULTURE, GROUP A STREP (THRC)

## 2022-10-05 MED ORDER — LISINOPRIL-HYDROCHLOROTHIAZIDE 20-12.5 MG PO TABS
1.0000 | ORAL_TABLET | Freq: Every day | ORAL | 1 refills | Status: DC
  Filled 2022-10-05 – 2022-11-10 (×4): qty 30, 30d supply, fill #0

## 2022-10-12 ENCOUNTER — Other Ambulatory Visit: Payer: Self-pay

## 2022-10-25 ENCOUNTER — Other Ambulatory Visit: Payer: Self-pay

## 2022-11-01 ENCOUNTER — Other Ambulatory Visit: Payer: Self-pay

## 2022-11-05 ENCOUNTER — Other Ambulatory Visit: Payer: Self-pay

## 2022-11-08 ENCOUNTER — Encounter: Payer: Self-pay | Admitting: Pharmacist

## 2022-11-09 ENCOUNTER — Other Ambulatory Visit: Payer: Self-pay

## 2022-11-10 ENCOUNTER — Other Ambulatory Visit: Payer: Self-pay | Admitting: Family Medicine

## 2022-11-10 DIAGNOSIS — N898 Other specified noninflammatory disorders of vagina: Secondary | ICD-10-CM

## 2022-11-11 ENCOUNTER — Emergency Department (HOSPITAL_BASED_OUTPATIENT_CLINIC_OR_DEPARTMENT_OTHER): Payer: Medicaid Other

## 2022-11-11 ENCOUNTER — Emergency Department (HOSPITAL_BASED_OUTPATIENT_CLINIC_OR_DEPARTMENT_OTHER)
Admission: EM | Admit: 2022-11-11 | Discharge: 2022-11-11 | Disposition: A | Discharge status: Home / Self Care | Payer: Medicaid Other | Attending: Emergency Medicine | Admitting: Emergency Medicine

## 2022-11-11 DIAGNOSIS — I10 Essential (primary) hypertension: Secondary | ICD-10-CM

## 2022-11-11 DIAGNOSIS — R112 Nausea with vomiting, unspecified: Secondary | ICD-10-CM | POA: Diagnosis not present

## 2022-11-11 DIAGNOSIS — R519 Headache, unspecified: Secondary | ICD-10-CM | POA: Diagnosis not present

## 2022-11-11 LAB — CBC WITH DIFFERENTIAL/PLATELET
Abs Immature Granulocytes: 0.01 10*3/uL (ref 0.00–0.07)
Basophils Absolute: 0 10*3/uL (ref 0.0–0.1)
Basophils Relative: 0 %
Eosinophils Absolute: 0.1 10*3/uL (ref 0.0–0.5)
Eosinophils Relative: 2 %
HCT: 39.6 % (ref 36.0–46.0)
Hemoglobin: 12.7 g/dL (ref 12.0–15.0)
Immature Granulocytes: 0 %
Lymphocytes Relative: 40 %
Lymphs Abs: 1.9 10*3/uL (ref 0.7–4.0)
MCH: 26.3 pg (ref 26.0–34.0)
MCHC: 32.1 g/dL (ref 30.0–36.0)
MCV: 82.2 fL (ref 80.0–100.0)
Monocytes Absolute: 0.3 10*3/uL (ref 0.1–1.0)
Monocytes Relative: 7 %
Neutro Abs: 2.5 10*3/uL (ref 1.7–7.7)
Neutrophils Relative %: 51 %
Platelets: 284 10*3/uL (ref 150–400)
RBC: 4.82 MIL/uL (ref 3.87–5.11)
RDW: 13.2 % (ref 11.5–15.5)
WBC: 4.8 10*3/uL (ref 4.0–10.5)
nRBC: 0 % (ref 0.0–0.2)

## 2022-11-11 LAB — COMPREHENSIVE METABOLIC PANEL
ALT: 20 U/L (ref 0–44)
AST: 24 U/L (ref 15–41)
Albumin: 3.8 g/dL (ref 3.5–5.0)
Alkaline Phosphatase: 104 U/L (ref 38–126)
Anion gap: 8 (ref 5–15)
BUN: 8 mg/dL (ref 6–20)
CO2: 25 mmol/L (ref 22–32)
Calcium: 9.6 mg/dL (ref 8.9–10.3)
Chloride: 109 mmol/L (ref 98–111)
Creatinine, Ser: 0.44 mg/dL (ref 0.44–1.00)
GFR, Estimated: >60 mL/min (ref >60)
Glucose, Bld: 107 mg/dL — ABNORMAL HIGH (ref 70–99)
Potassium: 3.9 mmol/L (ref 3.5–5.1)
Sodium: 142 mmol/L (ref 135–145)
Total Bilirubin: 0.5 mg/dL (ref 0.3–1.2)
Total Protein: 7.4 g/dL (ref 6.5–8.1)

## 2022-11-11 MED ORDER — LISINOPRIL-HYDROCHLOROTHIAZIDE 20-12.5 MG PO TABS
1.0000 | ORAL_TABLET | Freq: Every day | ORAL | 0 refills | Status: DC
  Filled 2022-11-11: qty 30, 30d supply, fill #0

## 2022-11-11 MED ORDER — DIPHENHYDRAMINE HCL 50 MG/ML IJ SOLN
25.0000 mg | Freq: Once | INTRAMUSCULAR | Status: AC
  Administered 2022-11-11: 25 mg via INTRAVENOUS
  Filled 2022-11-11: qty 1

## 2022-11-11 MED ORDER — PROCHLORPERAZINE EDISYLATE 10 MG/2ML IJ SOLN
10.0000 mg | Freq: Once | INTRAMUSCULAR | Status: AC
  Administered 2022-11-11: 10 mg via INTRAVENOUS
  Filled 2022-11-11: qty 2

## 2022-11-11 MED ORDER — KETOROLAC TROMETHAMINE 15 MG/ML IJ SOLN
15.0000 mg | Freq: Once | INTRAMUSCULAR | Status: AC
  Administered 2022-11-11: 15 mg via INTRAVENOUS
  Filled 2022-11-11: qty 1

## 2022-11-11 MED ORDER — SODIUM CHLORIDE 0.9 % IV BOLUS
1000.0000 mL | Freq: Once | INTRAVENOUS | Status: AC
  Administered 2022-11-11: 1000 mL via INTRAVENOUS

## 2022-11-11 NOTE — ED Notes (Signed)
Discharge paperwork given and verbally understood. 

## 2022-11-11 NOTE — ED Triage Notes (Signed)
Pt reports intense headache since waking this morning, along with one episode of  nausea/vomiting.  Pt denies any h/o migraines.   No focal deficits observed in triage.  Pt has not taken any OTC or Rx medications for pain. Pt supposed to be taking BP pills, but has been unable to fill that Rx.  Pt AAOx4 NAD

## 2022-11-11 NOTE — Discharge Instructions (Addendum)
You received medication today to help with your headache.  The CT of your head did not show any acute findings.  I have represcribed your blood pressure medication, please try to take your medication to help with your blood pressure control.  Follow-up with your primary care physician in the upcoming month.

## 2022-11-11 NOTE — ED Provider Notes (Signed)
[place] EMERGENCY DEPARTMENT AT [facility] Provider Note   CSN: [id number] Arrival date & time: 11/11/22  1058     History  Chief Complaint  Patient presents with   Headache    Allison Dennis is a 47 y.o. female.  47 year old female with no past medical history presents to the ED with a chief complaint of sudden onset of headache which woke her up from her sleep today.  She describes a throbbing sensation to the frontal aspect radiating to her entire head.  Does report 1 episode of nausea and vomiting.  There is no alleviating or exacerbating factors.  There is some worsening symptoms with the light, endorsing some photophobia.  She does have a prior history of hypertension, however is noncompliant with medication due to financial strain.  She has been sick with upper respiratory symptoms over the last 3 weeks.  No neck pain, no family history of aneurysms, no changes in vision, no changes in her gait.  The history is provided by the patient.  Headache Associated symptoms: nausea, photophobia and vomiting   Associated symptoms: no abdominal pain, no back pain, no fever and no sore throat        Home Medications Prior to Admission medications   Medication Sig Start Date End Date Taking? Authorizing Provider  benzonatate (TESSALON) 100 MG capsule Take 1 capsule (100 mg total) by mouth 3 (three) times daily as needed for cough. 10/02/22   Zenia Resides, MD  clobetasol cream (TEMOVATE) 0.05 % Apply topically 2 (two) times daily. 03/29/22   Hoy Register, MD  fluticasone (FLONASE) 50 MCG/ACT nasal spray Place 1 spray into both nostrils daily for 3 days. 11/10/21 11/13/21  Gustavus Bryant, FNP  lisinopril-hydrochlorothiazide (ZESTORETIC) 20-12.5 MG tablet Take 1 tablet by mouth daily. (Must keep upcoming office visit for refills) 11/11/22 12/11/22  Claude Manges, PA-C  metroNIDAZOLE (METROGEL) 0.75 % vaginal gel Insert 1 applicator vaginally twice a week for bacterial vaginosis  prophylaxis 10/05/21   Hoy Register, MD  ondansetron (ZOFRAN-ODT) 4 MG disintegrating tablet Take 1 tablet (4 mg total) by mouth every 8 (eight) hours as needed for nausea or vomiting. 10/02/22   Zenia Resides, MD  lisinopril (PRINIVIL,ZESTRIL) 20 MG tablet TAKE 1 TABLET BY MOUTH EVERY DAY FOR BLOOD PRESSURE Patient not taking: No sig reported 03/26/13 09/08/20  Brock Bad, MD      Allergies    Patient has no known allergies.    Review of Systems   Review of Systems  Constitutional:  Negative for chills and fever.  HENT:  Negative for sore throat.   Eyes:  Positive for photophobia.  Respiratory:  Negative for shortness of breath.   Cardiovascular:  Negative for chest pain.  Gastrointestinal:  Positive for nausea and vomiting. Negative for abdominal pain.  Genitourinary:  Negative for flank pain.  Musculoskeletal:  Negative for back pain.  Neurological:  Positive for headaches.  All other systems reviewed and are negative.   Physical Exam Updated Vital Signs BP (!) 116/92 (BP Location: Right Arm)   Pulse 69   Temp 98 F (36.7 C) (Oral)   Resp (!) 22   SpO2 98%  Physical Exam Vitals and nursing note reviewed.  Constitutional:      Appearance: She is well-developed.     Comments: Teary eyed on exam  HENT:     Head: Normocephalic and atraumatic.  Cardiovascular:     Rate and Rhythm: Normal rate.  Pulmonary:  Effort: Pulmonary effort is normal.  Abdominal:     Palpations: Abdomen is soft.  Musculoskeletal:     Cervical back: Normal range of motion and neck supple.  Skin:    General: Skin is warm and dry.  Neurological:     Mental Status: She is alert and oriented to person, place, and time.     Comments: .Alert, oriented, thought content appropriate. Speech fluent without evidence of aphasia. Able to follow 2 step commands without difficulty.  Cranial Nerves:  II:  Peripheral visual fields grossly normal, pupils, round, reactive to light III,IV, VI: ptosis  not present, extra-ocular motions intact bilaterally  V,VII: smile symmetric, facial light touch sensation equal VIII: hearing grossly normal bilaterally  IX,X: midline uvula rise  XI: bilateral shoulder shrug equal and strong XII: midline tongue extension  Motor:  5/5 in upper and lower extremities bilaterally including strong and equal grip strength and dorsiflexion/plantar flexion Sensory: light touch normal in all extremities.  Cerebellar: normal finger-to-nose with bilateral upper extremities, pronator drift negative Gait: normal gait and balance       ED Results / Procedures / Treatments   Labs (all labs ordered are listed, but only abnormal results are displayed) Labs Reviewed  COMPREHENSIVE METABOLIC PANEL - Abnormal; Notable for the following components:      Result Value   Glucose, Bld 107 (*)    All other components within normal limits  CBC WITH DIFFERENTIAL/PLATELET    EKG None  Radiology CT Head Wo Contrast  Result Date: 11/11/2022 CLINICAL DATA:  Trauma, headaches EXAM: CT HEAD WITHOUT CONTRAST TECHNIQUE: Contiguous axial images were obtained from the base of the skull through the vertex without intravenous contrast. RADIATION DOSE REDUCTION: This exam was performed according to the departmental dose-optimization program which includes automated exposure control, adjustment of the mA and/or kV according to patient size and/or use of iterative reconstruction technique. COMPARISON:  None Available. FINDINGS: Brain: No acute intracranial findings are seen a noncontrast CT brain. There are no signs of bleeding within the cranium. Ventricles are not dilated. There is no focal edema or mass effect. Vascular: Unremarkable. Skull: No fracture is seen in calvarium. Sinuses/Orbits: There is mucosal thickening in the ethmoid and sphenoid sinuses. Air-fluid levels are seen in the maxillary sinuses. Other: There is increased amount of CSF in the sella suggesting partial empty sella.  IMPRESSION: No acute intracranial findings are seen in noncontrast CT brain. Acute and chronic sinusitis. Electronically Signed   By: Ernie Avena M.D.   On: 11/11/2022 12:10    Procedures Procedures    Medications Ordered in ED Medications  prochlorperazine (COMPAZINE) injection 10 mg (10 mg Intravenous Given 11/11/22 1240)  diphenhydrAMINE (BENADRYL) injection 25 mg (25 mg Intravenous Given 11/11/22 1239)  sodium chloride 0.9 % bolus 1,000 mL (1,000 mLs Intravenous New Bag/Given 11/11/22 1238)  ketorolac (TORADOL) 15 MG/ML injection 15 mg (15 mg Intravenous Given 11/11/22 1344)    ED Course/ Medical Decision Making/ A&P                             Medical Decision Making Amount and/or Complexity of Data Reviewed Labs: ordered. Radiology: ordered.  Risk Prescription drug management.   This patient presents to the ED for concern of headache, this involves a number of treatment options, and is a complaint that carries with it a high risk of complications and morbidity.  The differential diagnosis includes subarachnoid hemorrhage, bad headache versus migraine.  Co morbidities: Discussed in HPI   Brief History:  See HPI.   EMR reviewed including pt PMHx, past surgical history and past visits to ER.   See HPI for more details   Lab Tests:  I ordered and independently interpreted labs.  The pertinent results include:    I personally reviewed all laboratory work and imaging. Metabolic panel without any acute abnormality specifically kidney function within normal limits and no significant electrolyte abnormalities. CBC without leukocytosis or significant anemia.   Imaging Studies:  CT Head showed: No acute intracranial findings are seen in noncontrast CT brain.    Acute and chronic sinusitis.   Cardiac Monitoring:  The patient was maintained on a cardiac monitor.  I personally viewed and interpreted the cardiac monitored which showed an underlying rhythm of:  NSR EKG non-ischemic   Medicines ordered:  I ordered medication including benadryl, compazine, bolus  for headache cocktail  Reevaluation of the patient after these medicines showed that the patient improved I have reviewed the patients home medicines and have made adjustments as needed   Reevaluation:  After the interventions noted above I re-evaluated patient and found that they have :Resolved   Social Determinants of Health:  The patient's social determinants of health were a factor in the care of this patient  Problem List / ED Course:  Patient presents to the ED with a sudden onset of headache this morning which occurred while she was waking up, frontal aspect sharp stabbing in nature.  Did have 1 episode of nausea and vomiting, did not take any medication for improvement in her symptoms.  Arrived to the ED slight elevated blood pressure, she does have a history of hypertension and is noncompliant with medication due to financial strain. On evaluation patient is neurologically intact, has good upper and lower extremity movement.  Is without any focal deficits, no vision changes, no diplopia.  Despite with sudden onset of headache concern for intracranial pathology therefore CT head was ordered.  CT head did not show any acute findings aside from some chronic sinusitis.  Blood work was obtained due to patient being so hypertensive on arrival, this is without any signs of AKI, worsening her labs are within normal limits.  She received medication for pain control such as Toradol, Compazine, Benadryl, fluids with resolution in her symptoms. I discussed the results of her workup with patient at length.  I also discussed the importance of blood pressure control, therefore she was represcribed her lisinopril.  According to the husband at the bedside the patient only drinks soda's, we discussed the importance of hydrating with plenty of fluids such as water or Gatorade.  Patient's blood pressure  has improved since her arrival in the emergency department and I do not suspect any urgency or emergency with resolution of her headache.  Patient is agreeable to plan and treatment, hemodynamically stable for discharge.  Dispostion:  After consideration of the diagnostic results and the patients response to treatment, I feel that the patent would benefit from outpatient follow-up with primary care physician.    Portions of this note were generated with Scientist, clinical (histocompatibility and immunogenetics). Dictation errors may occur despite best attempts at proofreading.   Final Clinical Impression(s) / ED Diagnoses Final diagnoses:  Bad headache    Rx / DC Orders ED Discharge Orders          Ordered    lisinopril-hydrochlorothiazide (ZESTORETIC) 20-12.5 MG tablet  Daily       Note to Pharmacy: Must keep upcoming  office visit for refills   11/11/22 1410              Claude MangesSoto, Tyrek Lawhorn, [place] JerseyPA-C 11/11/22 1416    Melene PlanFloyd, Dan, DO 11/11/22 1420

## 2022-11-12 ENCOUNTER — Other Ambulatory Visit: Payer: Self-pay

## 2022-11-12 ENCOUNTER — Encounter: Payer: Self-pay | Admitting: Family Medicine

## 2022-11-12 ENCOUNTER — Ambulatory Visit: Payer: Medicaid Other | Attending: Family Medicine | Admitting: Family Medicine

## 2022-11-12 VITALS — BP 154/93 | HR 85 | Wt 292.0 lb

## 2022-11-12 DIAGNOSIS — I10 Essential (primary) hypertension: Secondary | ICD-10-CM | POA: Diagnosis not present

## 2022-11-12 DIAGNOSIS — R053 Chronic cough: Secondary | ICD-10-CM

## 2022-11-12 DIAGNOSIS — Z131 Encounter for screening for diabetes mellitus: Secondary | ICD-10-CM | POA: Diagnosis not present

## 2022-11-12 DIAGNOSIS — Z1231 Encounter for screening mammogram for malignant neoplasm of breast: Secondary | ICD-10-CM

## 2022-11-12 MED ORDER — FLUTICASONE PROPIONATE 50 MCG/ACT NA SUSP
1.0000 | Freq: Every day | NASAL | 1 refills | Status: DC
  Filled 2022-11-12: qty 16, 30d supply, fill #0
  Filled 2023-03-25 – 2023-05-17 (×2): qty 16, 30d supply, fill #1

## 2022-11-12 MED ORDER — METRONIDAZOLE 0.75 % VA GEL
VAGINAL | 6 refills | Status: DC
  Filled 2022-11-12: qty 70, 30d supply, fill #0
  Filled 2023-05-17 – 2023-07-25 (×2): qty 70, 30d supply, fill #1
  Filled 2023-10-14: qty 70, 30d supply, fill #2

## 2022-11-12 MED ORDER — LISINOPRIL-HYDROCHLOROTHIAZIDE 20-12.5 MG PO TABS
1.0000 | ORAL_TABLET | Freq: Every day | ORAL | 1 refills | Status: DC
  Filled 2022-11-12: qty 90, 90d supply, fill #0
  Filled 2022-12-19: qty 30, 30d supply, fill #0
  Filled 2023-02-11 – 2023-02-25 (×2): qty 30, 30d supply, fill #1
  Filled 2023-03-25: qty 30, 30d supply, fill #2
  Filled 2023-05-17 – 2023-06-05 (×2): qty 30, 30d supply, fill #3
  Filled 2023-07-25: qty 30, 30d supply, fill #4
  Filled 2023-09-06: qty 30, 30d supply, fill #5

## 2022-11-12 MED ORDER — CETIRIZINE HCL 10 MG PO TABS
10.0000 mg | ORAL_TABLET | Freq: Every day | ORAL | 1 refills | Status: DC
  Filled 2022-11-12: qty 30, 30d supply, fill #0
  Filled 2023-02-25 – 2023-06-05 (×5): qty 30, 30d supply, fill #1

## 2022-11-12 NOTE — Progress Notes (Signed)
Subjective:  Patient ID: Allison Dennis, female    DOB: 12/10/1975  Age: 47 y.o. MRN: [id number]  CC: Hypertension   HPI Allison Spinnerliza N Huizenga is a 47 y.o. year old female with a history of pretension seen for an office visit.  Interval History: Her BP is elevated and she endorses being out of her medication but she picked this up at the pharmacy just before this appointment.  She does not exercise regularly. She complains of chronic cough which has been intermittent.  She has weeks when she feels fine and then symptoms start with cough, congestion and postnasal drip then she feels sick which she describes as having malaise, chills. At the moment she feels fine. 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 Past Medical History:  Diagnosis Date   Anemia    history   Hypertension    SVD (spontaneous vaginal delivery)    x 3 - 1 set of twins    Past Surgical History:  Procedure Laterality Date   CESAREAN SECTION     x 1   DILITATION & CURRETTAGE/HYSTROSCOPY WITH HYDROTHERMAL ABLATION N/A 12/18/2013   Procedure: DILATATION & CURETTAGE/HYSTEROSCOPY WITH HYDROTHERMAL ABLATION;  Surgeon: Brock Badharles A Harper, MD;  Location: [facility];  Service: Gynecology;  Laterality: N/A;   TONSILLECTOMY     TUBAL LIGATION Bilateral    WISDOM TOOTH EXTRACTION      History reviewed. No pertinent family history.  Social History   Socioeconomic History   Marital status: Single    Spouse name: Not on file   Number of children: Not on file   Years of education: Not on file   Highest education level: Not on file  Occupational History   Not on file  Tobacco Use   Smoking status: Never   Smokeless tobacco: Never  Substance and Sexual Activity   Alcohol use: Yes    Comment: Socially    Drug use: No   Sexual activity: Not Currently    Partners: Male    Birth control/protection: Surgical  Other Topics Concern   Not on file  Social History Narrative   Not on file   Social Determinants of  Health   Financial Resource Strain: Not on file  Food Insecurity: Not on file  Transportation Needs: Not on file  Physical Activity: Not on file  Stress: Not on file  Social Connections: Not on file    No Known Allergies  Outpatient Medications Prior to Visit  Medication Sig Dispense Refill   clobetasol cream (TEMOVATE) 0.05 % Apply topically 2 (two) times daily. 60 g 1   metroNIDAZOLE (METROGEL) 0.75 % vaginal gel Insert 1 applicator vaginally twice a week for bacterial vaginosis prophylaxis 70 g 6   ondansetron (ZOFRAN-ODT) 4 MG disintegrating tablet Take 1 tablet (4 mg total) by mouth every 8 (eight) hours as needed for nausea or vomiting. 10 tablet 0   lisinopril-hydrochlorothiazide (ZESTORETIC) 20-12.5 MG tablet Take 1 tablet by mouth daily. (Must keep upcoming office visit for refills) 30 tablet 0   benzonatate (TESSALON) 100 MG capsule Take 1 capsule (100 mg total) by mouth 3 (three) times daily as needed for cough. (Patient not taking: Reported on 11/12/2022) 21 capsule 0   fluticasone (FLONASE) 50 MCG/ACT nasal spray Place 1 spray into both nostrils daily for 3 days. 16 g 0   No facility-administered medications prior to visit.     ROS Review of Systems  Constitutional:  Negative for activity change and appetite change.  HENT:  Negative for congestion, sinus pressure and sore throat.   Respiratory:  Negative for cough, chest tightness, shortness of breath and wheezing.   Cardiovascular:  Negative for chest pain and palpitations.  Gastrointestinal:  Negative for abdominal distention, abdominal pain and constipation.  Genitourinary: Negative.   Musculoskeletal: Negative.   Psychiatric/Behavioral:  Negative for behavioral problems and dysphoric mood.     Objective:  BP (!) 154/93   Pulse 85   Wt 292 lb (132.5 kg)   SpO2 98%   BMI 48.59 kg/m      11/12/2022    3:45 PM 11/12/2022    3:30 PM 11/11/2022    2:30 PM  BP/Weight  Systolic BP 154 170 137  Diastolic BP 93 120  109  Wt. (Lbs)  292   BMI  48.59 kg/m2     Wt Readings from Last 3 Encounters:  11/12/22 292 lb (132.5 kg)  02/27/22 (!) 313 lb (142 kg)  11/28/21 (!) 316 lb 3.2 oz (143.4 kg)     Physical Exam Constitutional:      Appearance: She is well-developed.  Cardiovascular:     Rate and Rhythm: Normal rate.     Heart sounds: Normal heart sounds. No murmur heard. Pulmonary:     Effort: Pulmonary effort is normal.     Breath sounds: Normal breath sounds. No wheezing or rales.  Chest:     Chest wall: No tenderness.  Abdominal:     General: Bowel sounds are normal. There is no distension.     Palpations: Abdomen is soft. There is no mass.     Tenderness: There is no abdominal tenderness.  Musculoskeletal:        General: Normal range of motion.     Right lower leg: No edema.     Left lower leg: No edema.  Neurological:     Mental Status: She is alert and oriented to person, place, and time.  Psychiatric:        Mood and Affect: Mood normal.        Latest Ref Rng & Units 11/11/2022   11:50 AM 11/28/2021    3:38 PM 10/05/2021   10:28 AM  CMP  Glucose 70 - 99 mg/dL 503  88  99   BUN 6 - 20 mg/dL 8  6  9    Creatinine 0.44 - 1.00 mg/dL 5.46  5.68  1.27   Sodium 135 - 145 mmol/L 142  144  144   Potassium 3.5 - 5.1 mmol/L 3.9  4.8  4.6   Chloride 98 - 111 mmol/L 109  107  107   CO2 22 - 32 mmol/L 25  25  26    Calcium 8.9 - 10.3 mg/dL 9.6  9.9  9.8   Total Protein 6.5 - 8.1 g/dL 7.4  7.5  7.7   Total Bilirubin 0.3 - 1.2 mg/dL 0.5  0.5  0.4   Alkaline Phos 38 - 126 U/L 104  169  182   AST 15 - 41 U/L 24  26  23    ALT 0 - 44 U/L 20  30  21      Lipid Panel     Component Value Date/Time   CHOL 232 (H) 10/05/2021 1028   TRIG 63 10/05/2021 1028   HDL 85 10/05/2021 1028   LDLCALC 136 (H) 10/05/2021 1028    CBC    Component Value Date/Time   WBC 4.8 11/11/2022 1150   RBC 4.82 11/11/2022 1150   HGB 12.7 11/11/2022  1150   HCT 39.6 11/11/2022 1150   PLT 284 11/11/2022 1150    MCV 82.2 11/11/2022 1150   MCH 26.3 11/11/2022 1150   MCHC 32.1 11/11/2022 1150   RDW 13.2 11/11/2022 1150   LYMPHSABS 1.9 11/11/2022 1150   MONOABS 0.3 11/11/2022 1150   EOSABS 0.1 11/11/2022 1150   BASOSABS 0.0 11/11/2022 1150    Lab Results  Component Value Date   HGBA1C 5.9 (H) 10/05/2021    Assessment & Plan:  1. Essential hypertension Uncontrolled due to running out of medications which I have refilled Counseled on blood pressure goal of less than 130/80, low-sodium, DASH diet, medication compliance, 150 minutes of moderate intensity exercise per week. Discussed medication compliance, adverse effects. - LP+Non-HDL Cholesterol - CMP14+EGFR - CBC with Differential/Platelet - lisinopril-hydrochlorothiazide (ZESTORETIC) 20-12.5 MG tablet; Take 1 tablet by mouth daily. (Must keep upcoming office visit for refills)  Dispense: 90 tablet; Refill: 1  2. Encounter for screening mammogram for malignant neoplasm of breast - MM DIGITAL SCREENING BILATERAL; Future  3. Screening for diabetes mellitus - Hemoglobin A1c  4. Morbid obesity Counseled on caloric restriction and beginning a daily walking regimen starting with 10 minutes a day  5. 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  Dispense: 16 g; Refill: 1 - cetirizine (ZYRTEC) 10 MG tablet; Take 1 tablet (10 mg total) by mouth daily.  Dispense: 30 tablet; Refill: 1   Meds ordered this encounter  Medications   lisinopril-hydrochlorothiazide (ZESTORETIC) 20-12.5 MG tablet    Sig: Take 1 tablet by mouth daily. (Must keep upcoming office visit for refills)    Dispense:  90 tablet    Refill:  1   fluticasone (FLONASE) 50 MCG/ACT nasal spray    Sig: Place 1 spray into both nostrils daily.    Dispense:   16 g    Refill:  1   cetirizine (ZYRTEC) 10 MG tablet    Sig: Take 1 tablet (10 mg total) by mouth daily.    Dispense:  30 tablet    Refill:  1    Follow-up: Return in about 3 months (around 02/11/2023) for Chronic medical conditions.       Hoy Register, MD, FAAFP. [facility] [place], [place] [phone]   11/12/2022, 4:12 PM

## 2022-11-12 NOTE — Telephone Encounter (Signed)
Requested medication (s) are due for refill today: yes  Requested medication (s) are on the active medication list: yes  Last refill:  10/05/21  Future visit scheduled: yes  Notes to clinic:  Medication not assigned to a protocol, review manually.      Requested Prescriptions  Pending Prescriptions Disp Refills   metroNIDAZOLE (METROGEL) 0.75 % vaginal gel 70 g 6    Sig: Insert 1 applicator vaginally twice a week for bacterial vaginosis prophylaxis     Off-Protocol Failed - 11/10/2022 12:26 AM      Failed - Medication not assigned to a protocol, review manually.      Passed - Valid encounter within last 12 months    Recent Outpatient Visits           8 months ago Essential hypertension   [facility] [place], Odette Horns, MD   11 months ago Annual physical exam   [place] [facility] Hoy Register, MD   1 year ago Vaginal discharge   [facility] Hoy Register, MD       Future Appointments             Today Hoy Register, MD [facility]

## 2022-11-12 NOTE — Patient Instructions (Signed)

## 2022-11-14 ENCOUNTER — Other Ambulatory Visit: Payer: Self-pay

## 2022-11-14 ENCOUNTER — Ambulatory Visit: Payer: Medicaid Other | Attending: Family Medicine

## 2022-11-14 DIAGNOSIS — Z131 Encounter for screening for diabetes mellitus: Secondary | ICD-10-CM | POA: Diagnosis not present

## 2022-11-14 DIAGNOSIS — I10 Essential (primary) hypertension: Secondary | ICD-10-CM | POA: Diagnosis not present

## 2022-11-16 ENCOUNTER — Other Ambulatory Visit: Payer: Self-pay

## 2022-11-22 LAB — CMP14+EGFR
ALT: 24 IU/L (ref 0–32)
AST: 24 IU/L (ref 0–40)
Albumin/Globulin Ratio: 1.1 — ABNORMAL LOW (ref 1.2–2.2)
Albumin: 4 g/dL (ref 3.9–4.9)
Alkaline Phosphatase: 146 IU/L — ABNORMAL HIGH (ref 44–121)
BUN/Creatinine Ratio: 13 (ref 9–23)
BUN: 7 mg/dL (ref 6–24)
Bilirubin Total: 0.5 mg/dL (ref 0.0–1.2)
CO2: 23 mmol/L (ref 20–29)
Calcium: 9.9 mg/dL (ref 8.7–10.2)
Chloride: 108 mmol/L — ABNORMAL HIGH (ref 96–106)
Creatinine, Ser: 0.53 mg/dL — ABNORMAL LOW (ref 0.57–1.00)
Globulin, Total: 3.5 g/dL (ref 1.5–4.5)
Glucose: 92 mg/dL (ref 70–99)
Potassium: 4.2 mmol/L (ref 3.5–5.2)
Sodium: 145 mmol/L — ABNORMAL HIGH (ref 134–144)
Total Protein: 7.5 g/dL (ref 6.0–8.5)
eGFR: 117 mL/min/{1.73_m2} (ref >59)

## 2022-11-22 LAB — CBC WITH DIFFERENTIAL/PLATELET
Basophils Absolute: 0 10*3/uL (ref 0.0–0.2)
Basos: 0 %
EOS (ABSOLUTE): 0.1 10*3/uL (ref 0.0–0.4)
Eos: 1 %
Hematocrit: 39.1 % (ref 34.0–46.6)
Hemoglobin: 13 g/dL (ref 11.1–15.9)
Immature Grans (Abs): 0 10*3/uL (ref 0.0–0.1)
Immature Granulocytes: 0 %
Lymphocytes Absolute: 2.8 10*3/uL (ref 0.7–3.1)
Lymphs: 55 %
MCH: 27.1 pg (ref 26.6–33.0)
MCHC: 33.2 g/dL (ref 31.5–35.7)
MCV: 82 fL (ref 79–97)
Monocytes Absolute: 0.5 10*3/uL (ref 0.1–0.9)
Monocytes: 9 %
Neutrophils Absolute: 1.8 10*3/uL (ref 1.4–7.0)
Neutrophils: 35 %
Platelets: 297 10*3/uL (ref 150–450)
RBC: 4.8 x10E6/uL (ref 3.77–5.28)
RDW: 12.8 % (ref 11.7–15.4)
WBC: 5 10*3/uL (ref 3.4–10.8)

## 2022-11-22 LAB — LP+NON-HDL CHOLESTEROL
Cholesterol, Total: 220 mg/dL — ABNORMAL HIGH (ref 100–199)
HDL: 69 mg/dL (ref >39)
LDL Chol Calc (NIH): 139 mg/dL — ABNORMAL HIGH (ref 0–99)
Total Non-HDL-Chol (LDL+VLDL): 151 mg/dL — ABNORMAL HIGH (ref 0–129)
Triglycerides: 69 mg/dL (ref 0–149)
VLDL Cholesterol Cal: 12 mg/dL (ref 5–40)

## 2022-11-22 LAB — HEMOGLOBIN A1C
Est. average glucose Bld gHb Est-mCnc: 128 mg/dL
Hgb A1c MFr Bld: 6.1 % — ABNORMAL HIGH (ref 4.8–5.6)

## 2022-11-27 ENCOUNTER — Ambulatory Visit: Payer: Medicaid Other

## 2022-12-19 ENCOUNTER — Other Ambulatory Visit: Payer: Self-pay

## 2023-01-07 ENCOUNTER — Ambulatory Visit
Admission: RE | Admit: 2023-01-07 | Discharge: 2023-01-07 | Disposition: A | Discharge status: Home / Self Care | Payer: Medicaid Other | Source: Ambulatory Visit | Attending: Family Medicine | Admitting: Family Medicine

## 2023-01-07 DIAGNOSIS — Z1231 Encounter for screening mammogram for malignant neoplasm of breast: Secondary | ICD-10-CM | POA: Diagnosis not present

## 2023-02-18 ENCOUNTER — Other Ambulatory Visit: Payer: Self-pay

## 2023-02-25 ENCOUNTER — Other Ambulatory Visit: Payer: Self-pay

## 2023-03-04 ENCOUNTER — Other Ambulatory Visit: Payer: Self-pay

## 2023-04-01 ENCOUNTER — Other Ambulatory Visit: Payer: Self-pay

## 2023-04-24 ENCOUNTER — Encounter: Payer: Self-pay | Admitting: Physician Assistant

## 2023-04-24 ENCOUNTER — Ambulatory Visit: Payer: 59 | Admitting: Physician Assistant

## 2023-04-24 VITALS — BP 133/74 | HR 80 | Ht 65.0 in | Wt 282.0 lb

## 2023-04-24 DIAGNOSIS — Z111 Encounter for screening for respiratory tuberculosis: Secondary | ICD-10-CM | POA: Diagnosis not present

## 2023-04-24 DIAGNOSIS — Z021 Encounter for pre-employment examination: Secondary | ICD-10-CM | POA: Diagnosis not present

## 2023-04-24 NOTE — Patient Instructions (Signed)

## 2023-04-24 NOTE — Progress Notes (Signed)
Established Patient Office Visit  Subjective   Patient ID: Allison Dennis, female    DOB: Mar 21, 1976  Age: 47 y.o. 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  Denies any history of back or knee pain.  No other concerns at this time.    Past Medical History:  Diagnosis Date   Anemia    history   Hypertension    SVD (spontaneous vaginal delivery)    x 3 - 1 set of twins   Social History   Socioeconomic History   Marital status: Married    Spouse name: Not on file   Number of children: Not on file   Years of education: Not on file   Highest education level: Not on file  Occupational History   Not on file  Tobacco Use   Smoking status: Never   Smokeless tobacco: Never  Substance and Sexual Activity   Alcohol use: Yes    Comment: Socially    Drug use: No   Sexual activity: Not Currently    Partners: Male    Birth control/protection: Surgical  Other Topics Concern   Not on file  Social History Narrative   Not on file   Social Determinants of Health   Financial Resource Strain: Not on file  Food Insecurity: Not on file  Transportation Needs: Not on file  Physical Activity: Not on file  Stress: Not on file  Social Connections: Not on file  Intimate Partner Violence: Not on file   History reviewed. No pertinent family history. No Known Allergies  Review of Systems  Constitutional: Negative.   HENT: Negative.    Eyes: Negative.   Respiratory:  Negative for shortness of breath.   Cardiovascular:  Negative for chest pain.  Gastrointestinal: Negative.   Genitourinary: Negative.   Musculoskeletal:  Negative for back pain and joint pain.  Skin: Negative.   Neurological: Negative.   Endo/Heme/Allergies: Negative.   Psychiatric/Behavioral: Negative.        Objective:     BP 133/74 (BP Location: Left Arm, Patient Position:  Sitting, Cuff Size: Large)   Pulse 80   Ht 5\' 5"  (1.651 m)   Wt 282 lb (127.9 kg)   SpO2 98%   BMI 46.93 kg/m  BP Readings from Last 3 Encounters:  04/24/23 133/74  11/12/22 (!) 154/93  11/11/22 (!) 137/109   Wt Readings from Last 3 Encounters:  04/24/23 282 lb (127.9 kg)  11/12/22 292 lb (132.5 kg)  02/27/22 (!) 313 lb (142 kg)      Physical Exam Vitals and nursing note reviewed.  Constitutional:      Appearance: Normal appearance.  HENT:     Head: Normocephalic and atraumatic.     Right Ear: External ear normal.     Left Ear: External ear normal.     Nose: Nose normal.     Mouth/Throat:     Mouth: Mucous membranes are moist.     Pharynx: Oropharynx is clear.  Eyes:     Extraocular Movements: Extraocular movements intact.     Conjunctiva/sclera: Conjunctivae normal.     Pupils: Pupils are equal, round, and reactive to light.  Cardiovascular:     Rate and Rhythm: Normal rate and regular rhythm.     Pulses: Normal pulses.     Heart sounds: Normal heart sounds.  Pulmonary:  Effort: Pulmonary effort is normal.     Breath sounds: Normal breath sounds.  Musculoskeletal:        General: Normal range of motion.     Cervical back: Normal range of motion and neck supple.  Skin:    General: Skin is warm and dry.  Neurological:     General: No focal deficit present.     Mental Status: She is alert and oriented to person, place, and time.  Psychiatric:        Mood and Affect: Mood normal.        Behavior: Behavior normal.        Thought Content: Thought content normal.        Judgment: Judgment normal.         Assessment & Plan:   Problem List Items Addressed This Visit   None Visit Diagnoses     Pre-employment examination    -  Primary   Screening-pulmonary TB          1. Pre-employment examination Paperwork completed on patient's behalf.  Patient education given on general health maintenance.  2. Screening-pulmonary TB Patient given appointment to  have TB skin test [facility] in 2 days    I have reviewed the patient's medical history (PMH, PSH, Social History, Family History, Medications, and allergies) , and have been updated if relevant. I spent 20 minutes reviewing chart and  face to face time with patient.     Return in about 2 days (around 04/26/2023) for [facility].    Kasandra Knudsen Mayers, PA-C

## 2023-04-26 ENCOUNTER — Ambulatory Visit: Payer: 59 | Attending: Family Medicine

## 2023-04-26 DIAGNOSIS — Z111 Encounter for screening for respiratory tuberculosis: Secondary | ICD-10-CM

## 2023-04-26 NOTE — Progress Notes (Unsigned)
PPD Reading Note  PPD read and results entered in EpicCare.  Result: 0 mm induration.  Interpretation: neg  If test not read within 48-72 hours of initial placement, patient advised to repeat in other arm 1-3 weeks after this test.  Allergic reaction: no

## 2023-05-17 ENCOUNTER — Other Ambulatory Visit: Payer: Self-pay

## 2023-05-29 ENCOUNTER — Other Ambulatory Visit: Payer: Self-pay

## 2023-06-05 ENCOUNTER — Other Ambulatory Visit: Payer: Self-pay

## 2023-07-25 ENCOUNTER — Other Ambulatory Visit: Payer: Self-pay | Admitting: Family Medicine

## 2023-07-25 DIAGNOSIS — R053 Chronic cough: Secondary | ICD-10-CM

## 2023-07-26 ENCOUNTER — Other Ambulatory Visit: Payer: Self-pay

## 2023-07-26 MED ORDER — CETIRIZINE HCL 10 MG PO TABS
10.0000 mg | ORAL_TABLET | Freq: Every day | ORAL | 0 refills | Status: DC
  Filled 2023-07-26: qty 30, 30d supply, fill #0
  Filled 2023-09-06: qty 30, 30d supply, fill #1
  Filled 2023-10-14: qty 30, 30d supply, fill #2

## 2023-07-26 NOTE — Telephone Encounter (Signed)
Requested Prescriptions  Pending Prescriptions Disp Refills   cetirizine (ZYRTEC) 10 MG tablet 30 tablet 1    Sig: Take 1 tablet (10 mg total) by mouth daily.     Ear, Nose, and Throat:  Antihistamines 2 Failed - 07/26/2023 10:30 AM      Failed - Cr in normal range and within 360 days    Creat  Date Value Ref Range Status  12/31/2012 0.60 0.50 - 1.10 mg/dL Final   Creatinine, Ser  Date Value Ref Range Status  11/14/2022 0.53 (L) 0.57 - 1.00 mg/dL Final         Passed - Valid encounter within last 12 months    Recent Outpatient Visits           8 months ago Encounter for screening mammogram for malignant neoplasm of breast   [facility] [place] Hoy Register, MD   1 year ago Essential hypertension   [facility] [place] Hoy Register, MD   1 year ago Annual physical exam   [facility] [place] Hoy Register, MD   1 year ago Vaginal discharge   [facility] [place] Hoy Register, MD

## 2023-08-01 ENCOUNTER — Other Ambulatory Visit: Payer: Self-pay

## 2023-09-06 ENCOUNTER — Other Ambulatory Visit: Payer: Self-pay

## 2023-09-10 ENCOUNTER — Other Ambulatory Visit: Payer: Self-pay

## 2023-09-11 ENCOUNTER — Other Ambulatory Visit: Payer: Self-pay

## 2023-09-11 ENCOUNTER — Emergency Department (HOSPITAL_BASED_OUTPATIENT_CLINIC_OR_DEPARTMENT_OTHER): Payer: Medicaid Other | Admitting: Radiology

## 2023-09-11 ENCOUNTER — Encounter (HOSPITAL_BASED_OUTPATIENT_CLINIC_OR_DEPARTMENT_OTHER): Payer: Self-pay | Admitting: Emergency Medicine

## 2023-09-11 DIAGNOSIS — R1084 Generalized abdominal pain: Secondary | ICD-10-CM | POA: Insufficient documentation

## 2023-09-11 DIAGNOSIS — Z20822 Contact with and (suspected) exposure to covid-19: Secondary | ICD-10-CM | POA: Insufficient documentation

## 2023-09-11 DIAGNOSIS — Z79899 Other long term (current) drug therapy: Secondary | ICD-10-CM | POA: Insufficient documentation

## 2023-09-11 DIAGNOSIS — I10 Essential (primary) hypertension: Secondary | ICD-10-CM | POA: Diagnosis not present

## 2023-09-11 DIAGNOSIS — R059 Cough, unspecified: Secondary | ICD-10-CM | POA: Diagnosis present

## 2023-09-11 DIAGNOSIS — R6883 Chills (without fever): Secondary | ICD-10-CM | POA: Diagnosis not present

## 2023-09-11 DIAGNOSIS — J101 Influenza due to other identified influenza virus with other respiratory manifestations: Secondary | ICD-10-CM | POA: Diagnosis not present

## 2023-09-11 DIAGNOSIS — R0981 Nasal congestion: Secondary | ICD-10-CM | POA: Diagnosis not present

## 2023-09-11 DIAGNOSIS — R058 Other specified cough: Secondary | ICD-10-CM | POA: Diagnosis not present

## 2023-09-11 LAB — RESP PANEL BY RT-PCR (RSV, FLU A&B, COVID)  RVPGX2
Influenza A by PCR: POSITIVE — AB
Influenza B by PCR: NEGATIVE
Resp Syncytial Virus by PCR: NEGATIVE
SARS Coronavirus 2 by RT PCR: NEGATIVE

## 2023-09-11 NOTE — ED Triage Notes (Signed)
 Pt to ED from home c/o nasal congestion, productive green cough, body aches, and chills x2 days.

## 2023-09-12 ENCOUNTER — Emergency Department (HOSPITAL_BASED_OUTPATIENT_CLINIC_OR_DEPARTMENT_OTHER)
Admission: EM | Admit: 2023-09-12 | Discharge: 2023-09-12 | Disposition: A | Discharge status: Home / Self Care | Payer: Medicaid Other | Attending: Emergency Medicine | Admitting: Emergency Medicine

## 2023-09-12 DIAGNOSIS — J111 Influenza due to unidentified influenza virus with other respiratory manifestations: Secondary | ICD-10-CM

## 2023-09-12 MED ORDER — ACETAMINOPHEN 325 MG PO TABS
650.0000 mg | ORAL_TABLET | Freq: Once | ORAL | Status: AC
  Administered 2023-09-12: 650 mg via ORAL
  Filled 2023-09-12: qty 2

## 2023-09-12 MED ORDER — OSELTAMIVIR PHOSPHATE 75 MG PO CAPS: 75.0000 mg | ORAL_CAPSULE | Freq: Two times a day (BID) | ORAL | 0 refills | Status: DC

## 2023-09-12 MED ORDER — IBUPROFEN 800 MG PO TABS
800.0000 mg | ORAL_TABLET | Freq: Once | ORAL | Status: AC
  Administered 2023-09-12: 800 mg via ORAL
  Filled 2023-09-12: qty 1

## 2023-09-12 MED ORDER — ONDANSETRON 4 MG PO TBDP
4.0000 mg | ORAL_TABLET | Freq: Once | ORAL | Status: AC
  Administered 2023-09-12: 4 mg via ORAL
  Filled 2023-09-12: qty 1

## 2023-09-12 NOTE — ED Notes (Signed)
 AVS provided by edp was reviewed with pt. Pt verbalized understanding with no additional questions at this time.

## 2023-09-12 NOTE — ED Provider Notes (Signed)
 [place] EMERGENCY DEPARTMENT AT [facility] Provider Note   CSN: [id number] Arrival date & time: 09/11/23  2203     History  Chief Complaint  Patient presents with   Generalized Body Aches   Nasal Congestion    Allison Dennis is a 48 y.o. female.  Patient with a history of hypertension here with a 2-day history of bodyaches, nasal congestion, chills, productive cough of green mucus, nausea and vomiting.  Did not check her temperature at home but felt warm.  Has nasal congestion, productive cough of green mucus, body aches, nausea, chills.  No chest pain or shortness of breath.  Diffuse abdominal tenderness.  Has had sick contacts at home.  Denies any difficulty breathing or difficulty swallowing.  Does not think she has had a fever.  No pain with urination or blood in the urine.  Blood pressure elevated.  Takes her blood pressure medication at night.  The history is provided by the patient.       Home Medications Prior to Admission medications   Medication Sig Start Date End Date Taking? Authorizing Provider  cetirizine  (ZYRTEC ) 10 MG tablet Take 1 tablet (10 mg total) by mouth daily. Please schedule appt for further refills 07/26/23   Newlin, Enobong, MD  clobetasol  cream (TEMOVATE ) 0.05 % Apply topically 2 (two) times daily. Patient not taking: Reported on 04/24/2023 03/29/22   Newlin, Enobong, MD  fluticasone  (FLONASE ) 50 MCG/ACT nasal spray Place 1 spray into both nostrils daily. Patient not taking: Reported on 04/24/2023 11/12/22   Newlin, Enobong, MD  lisinopril -hydrochlorothiazide  (ZESTORETIC ) 20-12.5 MG tablet Take 1 tablet by mouth daily. (Must keep upcoming office visit for refills) 11/12/22   Newlin, Enobong, MD  metroNIDAZOLE  (METROGEL ) 0.75 % vaginal gel Insert 1 applicator vaginally twice a week for bacterial vaginosis prophylaxis Patient not taking: Reported on 04/24/2023 11/12/22   Newlin, Enobong, MD  ondansetron  (ZOFRAN -ODT) 4 MG disintegrating tablet Take 1  tablet (4 mg total) by mouth every 8 (eight) hours as needed for nausea or vomiting. Patient not taking: Reported on 04/24/2023 10/02/22   Vonna Sharlet POUR, MD  lisinopril  (PRINIVIL ,ZESTRIL ) 20 MG tablet TAKE 1 TABLET BY MOUTH EVERY DAY FOR BLOOD PRESSURE Patient not taking: No sig reported 03/26/13 09/08/20  Rudy Carlin LABOR, MD      Allergies    Patient has no known allergies.    Review of Systems   Review of Systems  Constitutional:  Positive for activity change, appetite change, fatigue and fever.  HENT:  Positive for congestion, rhinorrhea and sore throat.   Respiratory:  Positive for cough. Negative for chest tightness and shortness of breath.   Cardiovascular:  Negative for chest pain.  Gastrointestinal:  Positive for abdominal pain, nausea and vomiting.  Genitourinary:  Negative for dysuria and urgency.  Musculoskeletal:  Positive for arthralgias and myalgias.  Skin:  Negative for rash.  Neurological:  Positive for weakness and headaches.   all other systems are negative except as noted in the HPI and PMH.    Physical Exam Updated Vital Signs BP (!) 143/102 (BP Location: Right Arm)   Pulse 98   Temp (!) 100.5 F (38.1 C) (Oral)   Resp 19   Ht 5' 5 (1.651 m)   Wt 129.3 kg   SpO2 99%   BMI 47.43 kg/m  Physical Exam Vitals and nursing note reviewed.  Constitutional:      General: She is not in acute distress.    Appearance: She is well-developed. She is not  ill-appearing.     Comments: No distress, speaking in full sentence  HENT:     Head: Normocephalic and atraumatic.     Mouth/Throat:     Mouth: Mucous membranes are moist.     Pharynx: No oropharyngeal exudate.  Eyes:     Conjunctiva/sclera: Conjunctivae normal.     Pupils: Pupils are equal, round, and reactive to light.  Neck:     Comments: No meningismus. Cardiovascular:     Rate and Rhythm: Normal rate and regular rhythm.     Heart sounds: Normal heart sounds. No murmur heard. Pulmonary:     Effort:  Pulmonary effort is normal. No respiratory distress.     Breath sounds: Normal breath sounds. No wheezing.  Abdominal:     Palpations: Abdomen is soft.     Tenderness: There is abdominal tenderness. There is no guarding or rebound.     Comments: Mild diffuse tenderness  Musculoskeletal:        General: No tenderness. Normal range of motion.     Cervical back: Normal range of motion and neck supple.  Skin:    General: Skin is warm.  Neurological:     Mental Status: She is alert and oriented to person, place, and time.     Cranial Nerves: No cranial nerve deficit.     Motor: No abnormal muscle tone.     Coordination: Coordination normal.     Comments:  5/5 strength throughout. CN 2-12 intact.Equal grip strength.   Psychiatric:        Behavior: Behavior normal.     ED Results / Procedures / Treatments   Labs (all labs ordered are listed, but only abnormal results are displayed) Labs Reviewed  RESP PANEL BY RT-PCR (RSV, FLU A&B, COVID)  RVPGX2 - Abnormal; Notable for the following components:      Result Value   Influenza A by PCR POSITIVE (*)    All other components within normal limits    EKG None  Radiology DG Chest 2 View Result Date: 09/11/2023 CLINICAL DATA:  Productive cough, nasal congestion, body aches, and chills for 2 days. EXAM: CHEST - 2 VIEW COMPARISON:  09/16/2017 FINDINGS: Slightly shallow inspiration. Heart size and pulmonary vascularity are normal. Lungs are clear. No pleural effusion or pneumothorax. Mediastinal contours appear intact. Degenerative changes in the spine. IMPRESSION: No active cardiopulmonary disease. Electronically Signed   By: Elsie Gravely M.D.   On: 09/11/2023 23:42    Procedures Procedures    Medications Ordered in ED Medications  ondansetron  (ZOFRAN -ODT) disintegrating tablet 4 mg (has no administration in time range)  ibuprofen  (ADVIL ) tablet 800 mg (has no administration in time range)  acetaminophen  (TYLENOL ) tablet 650 mg (has  no administration in time range)    ED Course/ Medical Decision Making/ A&P                                 Medical Decision Making Amount and/or Complexity of Data Reviewed Labs: ordered. Decision-making details documented in ED Course. Radiology: ordered and independent interpretation performed. Decision-making details documented in ED Course. ECG/medicine tests: ordered and independent interpretation performed. Decision-making details documented in ED Course.  Risk OTC drugs. Prescription drug management.  2 days of URI symptoms of cough, congestion, bodyaches, fever.  No hypoxia or increased work of breathing.  No chest pain or shortness of breath.    Influenza swab is positive.  Will hydrate.  Treat supportively with  antipyretics and p.o. fluids.  She is agreeable to Tamiflu  after discussion of the risk and benefits.  Denies any history of chronic kidney disease.  Chest x-ray obtained in triage is negative for infiltrate or edema.  Results reviewed and interpreted by me.  Discussed p.o. hydration at home, antiemetics pyretics and PCP follow-up.  Return to the ED with difficulty breathing, difficulty swallowing, chest pain, shortness of breath, any other concerns.       Final Clinical Impression(s) / ED Diagnoses Final diagnoses:  Influenza    Rx / DC Orders ED Discharge Orders     None         Jany Buckwalter, Garnette, MD 09/12/23 [phone]

## 2023-09-12 NOTE — Discharge Instructions (Signed)
 Keep yourself hydrated.  Use Tylenol  or Motrin  as needed for aches and pains.  Follow-up with your doctor.  Return to ED with difficulty breathing, difficulty eating and drinking, chest pain, any other concerns.

## 2023-09-12 NOTE — ED Notes (Signed)
 Pt tolerating fluids. Edp informed

## 2023-10-14 ENCOUNTER — Other Ambulatory Visit: Payer: Self-pay

## 2023-10-14 ENCOUNTER — Other Ambulatory Visit: Payer: Self-pay | Admitting: Family Medicine

## 2023-10-14 DIAGNOSIS — I10 Essential (primary) hypertension: Secondary | ICD-10-CM

## 2023-10-14 MED ORDER — LISINOPRIL-HYDROCHLOROTHIAZIDE 20-12.5 MG PO TABS
1.0000 | ORAL_TABLET | Freq: Every day | ORAL | 0 refills | Status: DC
  Filled 2023-10-14: qty 30, 30d supply, fill #0

## 2023-10-15 ENCOUNTER — Other Ambulatory Visit: Payer: Self-pay

## 2023-10-21 ENCOUNTER — Other Ambulatory Visit: Payer: Self-pay

## 2023-11-20 ENCOUNTER — Other Ambulatory Visit: Payer: Self-pay | Admitting: Family Medicine

## 2023-11-20 DIAGNOSIS — I10 Essential (primary) hypertension: Secondary | ICD-10-CM

## 2023-11-20 DIAGNOSIS — R053 Chronic cough: Secondary | ICD-10-CM

## 2023-11-21 ENCOUNTER — Other Ambulatory Visit: Payer: Self-pay

## 2023-11-22 ENCOUNTER — Other Ambulatory Visit: Payer: Self-pay

## 2023-11-22 ENCOUNTER — Other Ambulatory Visit: Payer: Self-pay | Admitting: Family Medicine

## 2023-11-22 DIAGNOSIS — R053 Chronic cough: Secondary | ICD-10-CM

## 2023-11-22 DIAGNOSIS — I10 Essential (primary) hypertension: Secondary | ICD-10-CM

## 2023-11-25 ENCOUNTER — Ambulatory Visit: Payer: Self-pay

## 2023-11-25 NOTE — Telephone Encounter (Signed)
 Noted.

## 2023-11-25 NOTE — Telephone Encounter (Signed)
 Copied from CRM [id number]. Topic: Clinical - Red Word Triage >> Nov 25, 2023  2:05 PM Rosamond Comes wrote: Red Word that prompted transfer to Nurse Triage: patient calling in, having headaches, out of lisinopril -hydrochlorothiazide  (ZESTORETIC ) 20-12.5 MG tablet  last pill taken 11/20/23  Pharmacy needs a new prescription written.  Chief Complaint: headaches Symptoms: headaches, bilateral eye pain Frequency: yesterday Pertinent Negatives: Patient denies  SOB Disposition: [] ED /[] Urgent Care (no appt availability in office) / [x] Appointment(In office/virtual)/ []  [facility]/ [] Home Care/ [] Refused Recommended Disposition /[] [facility]/ []  Follow-up with PCP Additional Notes: out of BP medication since 11/20/2023 -now having headaches.  Scheduled in office appt with provider w/n PCP office  Reason for Disposition  [1] MODERATE headache (e.g., interferes with normal activities) AND [2] present > 24 hours AND [3] unexplained  (Exceptions: analgesics not tried, typical migraine, or headache part of viral illness)  Answer Assessment - Initial Assessment Questions 1. LOCATION: "Where does it hurt?"      Front of head 2. ONSET: "When did the headache start?" (Minutes, hours or days)      yesterday 3. PATTERN: "Does the pain come and go, or has it been constant since it started?"     Comes and goes 4. SEVERITY: "How bad is the pain?" and "What does it keep you from doing?"  (e.g., Scale 1-10; mild, moderate, or severe)   - MILD (1-3): doesn't interfere with normal activities    - MODERATE (4-7): interferes with normal activities or awakens from sleep    - SEVERE (8-10): excruciating pain, unable to do any normal activities        Mild to moderate 5. RECURRENT SYMPTOM: "Have you ever had headaches before?" If Yes, ask: "When was the last time?" and "What happened that time?"      no 6. CAUSE: "What do you think is causing the headache?"     Last dose BP 11/20/2023 7. MIGRAINE:  "Have you been diagnosed with migraine headaches?" If Yes, ask: "Is this headache similar?"      no 8. HEAD INJURY: "Has there been any recent injury to the head?"      no 9. OTHER SYMPTOMS: "Do you have any other symptoms?" (fever, stiff neck, eye pain, sore throat, cold symptoms)     Bilateral mild Eye pain 10. PREGNANCY: "Is there any chance you are pregnant?" "When was your last menstrual period?"       no  Protocols used: Headache-A-AH

## 2023-11-26 ENCOUNTER — Other Ambulatory Visit: Payer: Self-pay

## 2023-11-26 ENCOUNTER — Ambulatory Visit: Payer: Self-pay | Admitting: Internal Medicine

## 2023-11-26 ENCOUNTER — Encounter: Payer: Self-pay | Admitting: Pharmacist

## 2023-11-27 ENCOUNTER — Ambulatory Visit: Admitting: Physician Assistant

## 2023-11-27 ENCOUNTER — Ambulatory Visit: Admitting: Pharmacist

## 2023-11-27 ENCOUNTER — Other Ambulatory Visit: Payer: Self-pay

## 2023-11-27 ENCOUNTER — Encounter: Payer: Self-pay | Admitting: Physician Assistant

## 2023-11-27 VITALS — BP 135/80 | HR 74 | Ht 65.0 in | Wt 282.0 lb

## 2023-11-27 DIAGNOSIS — Z13220 Encounter for screening for lipoid disorders: Secondary | ICD-10-CM

## 2023-11-27 DIAGNOSIS — J302 Other seasonal allergic rhinitis: Secondary | ICD-10-CM

## 2023-11-27 DIAGNOSIS — E66813 Obesity, class 3: Secondary | ICD-10-CM | POA: Diagnosis not present

## 2023-11-27 DIAGNOSIS — R7303 Prediabetes: Secondary | ICD-10-CM | POA: Diagnosis not present

## 2023-11-27 DIAGNOSIS — I10 Essential (primary) hypertension: Secondary | ICD-10-CM

## 2023-11-27 DIAGNOSIS — R748 Abnormal levels of other serum enzymes: Secondary | ICD-10-CM

## 2023-11-27 DIAGNOSIS — E559 Vitamin D deficiency, unspecified: Secondary | ICD-10-CM

## 2023-11-27 DIAGNOSIS — Z6841 Body Mass Index (BMI) 40.0 and over, adult: Secondary | ICD-10-CM

## 2023-11-27 LAB — POCT GLYCOSYLATED HEMOGLOBIN (HGB A1C): Hemoglobin A1C: 5.7 % — AB (ref 4.0–5.6)

## 2023-11-27 MED ORDER — CETIRIZINE HCL 10 MG PO TABS
10.0000 mg | ORAL_TABLET | Freq: Every day | ORAL | 1 refills | Status: AC
  Filled 2023-11-27: qty 30, 30d supply, fill #0
  Filled 2023-12-25: qty 30, 30d supply, fill #1
  Filled 2024-01-27 – 2024-02-21 (×4): qty 30, 30d supply, fill #2
  Filled 2024-05-26: qty 30, 30d supply, fill #3

## 2023-11-27 MED ORDER — FLUTICASONE PROPIONATE 50 MCG/ACT NA SUSP
1.0000 | Freq: Every day | NASAL | 1 refills | Status: AC
  Filled 2023-11-27: qty 16, 60d supply, fill #0
  Filled 2024-01-27 – 2024-02-10 (×2): qty 16, 60d supply, fill #1

## 2023-11-27 MED ORDER — LISINOPRIL-HYDROCHLOROTHIAZIDE 20-12.5 MG PO TABS
1.0000 | ORAL_TABLET | Freq: Every day | ORAL | 1 refills | Status: DC
  Filled 2023-11-27: qty 30, 30d supply, fill #0
  Filled 2023-12-25: qty 30, 30d supply, fill #1
  Filled 2024-01-27 – 2024-02-21 (×4): qty 30, 30d supply, fill #2
  Filled 2024-04-13: qty 30, 30d supply, fill #3
  Filled 2024-05-26: qty 30, 30d supply, fill #4
  Filled 2024-07-03: qty 30, 30d supply, fill #5

## 2023-11-27 NOTE — Progress Notes (Unsigned)
 Established Patient Office Visit  Subjective   Patient ID: Allison Dennis, female    DOB: September 01, 1975  Age: 48 y.o. MRN: [id number]  Chief Complaint  Patient presents with   Medication Refill    Bp medication    Discussed the use of AI scribe software for clinical note transcription with the patient, who gave verbal consent to proceed.  History of Present Illness   History of Present Illness The patient, with a history of hypertension, prediabetes, and allergies, presents for medication refills. She has been out of her blood pressure medication for a week, but denies any symptoms of hypotension. She has been unable to monitor her blood pressure at home due to equipment failure. She also requests refills for her allergy medications, Zyrtec  and Flonase .  The patient's cholesterol was slightly elevated at her last visit a year ago, and she was in the prediabetes range. She is on a vitamin D  supplement. She denies any new concerns.  The patient also reports a history of an ablation procedure 13 years ago and no longer has periods. She suspects she is in perimenopause due to experiencing hot flashes.       Past Medical History:  Diagnosis Date   Anemia    history   Hypertension    SVD (spontaneous vaginal delivery)    x 3 - 1 set of twins   Social History   Socioeconomic History   Marital status: Married    Spouse name: Not on file   Number of children: Not on file   Years of education: Not on file   Highest education level: Not on file  Occupational History   Not on file  Tobacco Use   Smoking status: Never   Smokeless tobacco: Never  Vaping Use   Vaping status: Never Used  Substance and Sexual Activity   Alcohol use: Yes    Comment: Socially    Drug use: No   Sexual activity: Not Currently    Partners: Male    Birth control/protection: Surgical  Other Topics Concern   Not on file  Social History Narrative   Not on file   Social Drivers of Health    Financial Resource Strain: Not on file  Food Insecurity: Not on file  Transportation Needs: Not on file  Physical Activity: Not on file  Stress: Not on file  Social Connections: Not on file  Intimate Partner Violence: Not on file   Family History  Problem Relation Age of Onset   Breast cancer Mother    Ovarian cancer Mother    Lung cancer Mother    Hypertension Father    Diabetes Father    Stomach cancer Paternal Grandmother    No Known Allergies  Review of Systems  Constitutional: Negative.   HENT: Negative.    Eyes: Negative.   Respiratory:  Negative for shortness of breath.   Cardiovascular:  Negative for chest pain.  Gastrointestinal: Negative.   Genitourinary: Negative.   Musculoskeletal: Negative.   Skin: Negative.   Neurological: Negative.   Endo/Heme/Allergies: Negative.   Psychiatric/Behavioral: Negative.        Objective:     BP 135/80 (BP Location: Left Arm, Patient Position: Sitting, Cuff Size: Large)   Pulse 74   Ht 5\' 5"  (1.651 m)   Wt 282 lb (127.9 kg)   SpO2 99%   BMI 46.93 kg/m  BP Readings from Last 3 Encounters:  11/27/23 135/80  09/12/23 (!) 143/102  04/24/23 133/74   Wt Readings  from Last 3 Encounters:  11/27/23 282 lb (127.9 kg)  09/11/23 285 lb (129.3 kg)  04/24/23 282 lb (127.9 kg)    Physical Exam Vitals and nursing note reviewed.  Constitutional:      Appearance: Normal appearance. She is obese.  HENT:     Head: Normocephalic and atraumatic.     Right Ear: External ear normal.     Left Ear: External ear normal.     Nose: Nose normal.     Mouth/Throat:     Mouth: Mucous membranes are moist.     Pharynx: Oropharynx is clear.  Eyes:     Extraocular Movements: Extraocular movements intact.     Conjunctiva/sclera: Conjunctivae normal.     Pupils: Pupils are equal, round, and reactive to light.  Cardiovascular:     Rate and Rhythm: Normal rate and regular rhythm.     Pulses: Normal pulses.     Heart sounds: Normal  heart sounds.  Pulmonary:     Effort: Pulmonary effort is normal.     Breath sounds: Normal breath sounds.  Musculoskeletal:        General: Normal range of motion.     Cervical back: Normal range of motion and neck supple.  Skin:    General: Skin is warm and dry.  Neurological:     General: No focal deficit present.     Mental Status: She is alert and oriented to person, place, and time.  Psychiatric:        Mood and Affect: Mood normal.        Behavior: Behavior normal.        Thought Content: Thought content normal.        Judgment: Judgment normal.        Assessment & Plan:   Problem List Items Addressed This Visit       Cardiovascular and Mediastinum   Essential hypertension - Primary   Relevant Medications   lisinopril -hydrochlorothiazide  (ZESTORETIC ) 20-12.5 MG tablet   Other Relevant Orders   CBC with Differential/Platelet (Completed)   Comp. Metabolic Panel (12) (Completed)   Other Visit Diagnoses       Prediabetes       Relevant Orders   Vitamin D , 25-hydroxy (Completed)   HgB A1c (Completed)     Screening, lipid       Relevant Orders   Lipid panel (Completed)     Seasonal allergies       Relevant Medications   cetirizine  (ZYRTEC ) 10 MG tablet   fluticasone  (FLONASE ) 50 MCG/ACT nasal spray     Class 3 severe obesity due to excess calories with serious comorbidity and body mass index (BMI) of 45.0 to 49.9 in adult [facility])       Relevant Orders   Vitamin D , 25-hydroxy (Completed)      Assessment and Plan Hypertension Hypertension management. Emphasized regular monitoring to prevent uncontrolled hypertension. - Refill antihypertensive medication. - Advise checking blood pressure bi-monthly. - Follow up with PCP in 6 months   Prediabetes Prediabetes with A1c of 5.7%. Emphasized lifestyle modifications to prevent diabetes progression. - Order labs: A1c, lipid panel, renal function, liver function, CBC, and vitamin D . - Advise avoiding sugary  beverages and maintaining a healthy diet.  Wellness Visit Routine wellness visit. Discussed mammogram and Pap smear history. Recommended lifestyle modifications for menopausal symptoms. - Advise on lifestyle modifications: adequate hydration, low sugar diet, and good sleep hygiene for menopausal symptoms.  Allergic rhinitis Allergic rhinitis requiring medication refills. - Refill Zyrtec  and  Flonase .   I have reviewed the patient's medical history (PMH, PSH, Social History, Family History, Medications, and allergies) , and have been updated if relevant. I spent 30 minutes reviewing chart and  face to face time with patient.    Return in about 6 months (around 05/28/2024) for At [facility].    Etter Hermann Mayers, PA-C

## 2023-11-27 NOTE — Patient Instructions (Addendum)
 VISIT SUMMARY:  You came in today for medication refills and a routine wellness visit. We discussed your history of hypertension, prediabetes, and allergies, and addressed your concerns about perimenopause symptoms.  YOUR PLAN:  -HYPERTENSION: Hypertension is high blood pressure, which can lead to serious health problems if not managed. We refilled your blood pressure medication and advised you to check your blood pressure twice a month. Please follow up with Dr. Newlin in 6 months.  -PREDIABETES: Prediabetes means your blood sugar levels are higher than normal but not high enough to be classified as diabetes. We ordered several lab tests, including A1c, lipid panel, renal function, liver function, CBC, and vitamin D . We also advised you to avoid sugary beverages and maintain a healthy diet to prevent progression to diabetes.  -WELLNESS VISIT: This was a routine wellness visit where we discussed your mammogram and Pap smear history. We recommended lifestyle changes to help with perimenopausal symptoms, such as staying hydrated, eating a low sugar diet, and maintaining good sleep hygiene.   -ALLERGIC RHINITIS: Allergic rhinitis is an allergic reaction that causes sneezing, congestion, and a runny nose. We refilled your Zyrtec  and Flonase .  Menopause: What to Know Menopause is the time in your life when your menstrual periods stop. It marks the end of your ability to get pregnant. It can be defined as not having a period for 12 months without another medical cause. The time when you start to move into menopause is called perimenopause. It often happens between ages 26-55. It can last for many years. During perimenopause, hormone levels change in your body. This can cause symptoms and affect your health. Menopause may make you more likely to have: Bones that are weak and break more easily. Depression. This is when you feel sad or hopeless. Arteries that harden and get narrow. These can cause heart  attacks and strokes. What are the causes? In most cases, menopause is a natural change to your body and hormone levels that happens as you get older. But in some cases, it may be caused by changes that aren't natural. These include: Surgery to take out both ovaries. Side effects from some medicines. What increases the risk? You're more likely to go through menopause early if: You have an abnormal growth (tumor) of the pituitary gland in your brain. You have a disease that affects your ovaries. You've had certain treatments for cancer. These include: Chemotherapy. Hormone therapy. Radiation therapy on the area between your hips (pelvis). You smoke a lot or drink a lot of alcohol. Other people in your family have gone through menopause early. You're very thin. What are the signs or symptoms? You may have: Hot flashes. Irregular periods. Night sweats. Changes in how you feel about sex. You may: Have less of a sex drive. Feel more discomfort around your sexuality. Vaginal dryness and thinning of the vaginal walls. This may make it hurt to have sex. Skin changes, such as: Dry skin. New wrinkles. Headaches. Other symptoms may include: Trouble sleeping. Mood swings. Memory problems. Weight gain. Hair growth on your face and chest. Bladder infections or trouble peeing. How is this diagnosed? You may be diagnosed based on: Your medical history. An exam. Your age. Your history of menstrual periods. Your symptoms. Hormone tests. How is this treated? In some cases, no treatment is needed. Talk with your health care provider about if you should get treated. Treatments may include: Menopausal hormone therapy (MHT). Medicines to treat certain symptoms. Acupuncture. Vitamin or herbal supplements. Before you  start treatment, let your provider know if you or anyone in your family has or has had: Heart disease. Breast cancer. Blood clots. Diabetes. Osteoporosis. Follow these  instructions at home: Eating and drinking  Eat a balanced diet. It should include: Fresh fruits and vegetables. Whole grains. Lean protein. Low-fat dairy. Eat lots of foods that have calcium and vitamin D  in them. These can help keep your bones healthy. Foods and drinks that are rich in calcium include: Yogurt and low-fat milk. Beans. Almonds. Sardines. Broccoli and kale. To help prevent hot flashes, stay away from: Alcohol. Drinks with caffeine in them. Spicy foods. Lifestyle Do not smoke, vape, or use nicotine or tobacco. Get 7-8 hours of sleep each night. If you have hot flashes, you may want to: Dress in layers. Avoid things that may trigger hot flashes, like warm places or stress. Take slow, deep breaths when a hot flash starts. Keep a fan in your home and office. Find ways to manage stress. You may want to try: Deep breathing. Meditation. Writing in a journal. Ask your provider about going to group therapy. Therapy can help you get support from others who are going through menopause. General instructions  Talk with your provider before you take any herbal supplements. Keep track of your symptoms. Track: When they start. How often you have them. How long they last. Use vaginal lubricants or moisturizers. These can help with: Vaginal dryness. Comfort during sex. Contact a health care provider if: You're older than 55 and still get periods. You have pain during sex. You haven't had a period for 12 months and then start to bleed from your vagina. It hurts to pee. You get very bad headaches. Get help right away if: You're very depressed. You have a lot of bleeding from your vagina. Your heart is beating too fast. You have very bad belly pain or indigestion that doesn't go away with medicines. This information is not intended to replace advice given to you by your health care provider. Make sure you discuss any questions you have with your health care  provider. Document Revised: 03/28/2023 Document Reviewed: 03/28/2023 Elsevier Patient Education  2024 [employer].

## 2023-11-28 ENCOUNTER — Other Ambulatory Visit: Payer: Self-pay

## 2023-11-28 ENCOUNTER — Encounter: Payer: Self-pay | Admitting: Physician Assistant

## 2023-11-28 DIAGNOSIS — J302 Other seasonal allergic rhinitis: Secondary | ICD-10-CM | POA: Insufficient documentation

## 2023-11-28 DIAGNOSIS — R748 Abnormal levels of other serum enzymes: Secondary | ICD-10-CM | POA: Insufficient documentation

## 2023-11-28 DIAGNOSIS — R7303 Prediabetes: Secondary | ICD-10-CM | POA: Insufficient documentation

## 2023-11-28 LAB — COMP. METABOLIC PANEL (12)
AST: 24 IU/L (ref 0–40)
Albumin: 4.4 g/dL (ref 3.9–4.9)
Alkaline Phosphatase: 146 IU/L — ABNORMAL HIGH (ref 44–121)
BUN/Creatinine Ratio: 18 (ref 9–23)
BUN: 10 mg/dL (ref 6–24)
Bilirubin Total: 0.3 mg/dL (ref 0.0–1.2)
Calcium: 9.6 mg/dL (ref 8.7–10.2)
Chloride: 102 mmol/L (ref 96–106)
Creatinine, Ser: 0.55 mg/dL — ABNORMAL LOW (ref 0.57–1.00)
Globulin, Total: 3.2 g/dL (ref 1.5–4.5)
Glucose: 60 mg/dL — ABNORMAL LOW (ref 70–99)
Potassium: 4.2 mmol/L (ref 3.5–5.2)
Sodium: 144 mmol/L (ref 134–144)
Total Protein: 7.6 g/dL (ref 6.0–8.5)
eGFR: 115 mL/min/{1.73_m2} (ref >59)

## 2023-11-28 LAB — CBC WITH DIFFERENTIAL/PLATELET
Basophils Absolute: 0.1 10*3/uL (ref 0.0–0.2)
Basos: 1 %
EOS (ABSOLUTE): 0.1 10*3/uL (ref 0.0–0.4)
Eos: 2 %
Hematocrit: 42.4 % (ref 34.0–46.6)
Hemoglobin: 13.4 g/dL (ref 11.1–15.9)
Immature Grans (Abs): 0 10*3/uL (ref 0.0–0.1)
Immature Granulocytes: 0 %
Lymphocytes Absolute: 3.5 10*3/uL — ABNORMAL HIGH (ref 0.7–3.1)
Lymphs: 59 %
MCH: 27.5 pg (ref 26.6–33.0)
MCHC: 31.6 g/dL (ref 31.5–35.7)
MCV: 87 fL (ref 79–97)
Monocytes Absolute: 0.4 10*3/uL (ref 0.1–0.9)
Monocytes: 7 %
Neutrophils Absolute: 1.9 10*3/uL (ref 1.4–7.0)
Neutrophils: 31 %
Platelets: 307 10*3/uL (ref 150–450)
RBC: 4.87 x10E6/uL (ref 3.77–5.28)
RDW: 13.2 % (ref 11.7–15.4)
WBC: 6 10*3/uL (ref 3.4–10.8)

## 2023-11-28 LAB — LIPID PANEL
Chol/HDL Ratio: 2.9 ratio (ref 0.0–4.4)
Cholesterol, Total: 227 mg/dL — ABNORMAL HIGH (ref 100–199)
HDL: 77 mg/dL (ref >39)
LDL Chol Calc (NIH): 136 mg/dL — ABNORMAL HIGH (ref 0–99)
Triglycerides: 82 mg/dL (ref 0–149)
VLDL Cholesterol Cal: 14 mg/dL (ref 5–40)

## 2023-11-28 LAB — VITAMIN D 25 HYDROXY (VIT D DEFICIENCY, FRACTURES): Vit D, 25-Hydroxy: 12.2 ng/mL — ABNORMAL LOW (ref 30.0–100.0)

## 2023-11-28 MED ORDER — VITAMIN D (ERGOCALCIFEROL) 1.25 MG (50000 UNIT) PO CAPS
50000.0000 [IU] | ORAL_CAPSULE | ORAL | 2 refills | Status: DC
  Filled 2023-11-28: qty 4, 28d supply, fill #0
  Filled 2023-12-25: qty 4, 28d supply, fill #1
  Filled 2024-01-27 – 2024-02-21 (×4): qty 4, 28d supply, fill #2

## 2023-11-28 NOTE — Addendum Note (Signed)
 Addended by: Malcom Scriver on: 11/28/2023 10:17 AM   Modules accepted: Orders

## 2023-12-04 ENCOUNTER — Other Ambulatory Visit: Payer: Self-pay

## 2023-12-16 ENCOUNTER — Ambulatory Visit (HOSPITAL_COMMUNITY)

## 2024-01-02 ENCOUNTER — Other Ambulatory Visit: Payer: Self-pay

## 2024-01-27 ENCOUNTER — Ambulatory Visit (HOSPITAL_COMMUNITY): Admission: RE | Admit: 2024-01-27 | Source: Ambulatory Visit

## 2024-02-06 ENCOUNTER — Other Ambulatory Visit: Payer: Self-pay

## 2024-02-19 ENCOUNTER — Other Ambulatory Visit: Payer: Self-pay

## 2024-02-21 ENCOUNTER — Other Ambulatory Visit: Payer: Self-pay

## 2024-03-24 DIAGNOSIS — H5213 Myopia, bilateral: Secondary | ICD-10-CM | POA: Diagnosis not present

## 2024-04-17 ENCOUNTER — Other Ambulatory Visit: Payer: Self-pay

## 2024-05-27 ENCOUNTER — Other Ambulatory Visit: Payer: Self-pay

## 2024-06-03 ENCOUNTER — Other Ambulatory Visit: Payer: Self-pay

## 2024-06-05 ENCOUNTER — Other Ambulatory Visit: Payer: Self-pay

## 2024-07-09 ENCOUNTER — Other Ambulatory Visit: Payer: Self-pay

## 2024-08-13 ENCOUNTER — Other Ambulatory Visit: Payer: Self-pay | Admitting: Physician Assistant

## 2024-08-13 ENCOUNTER — Other Ambulatory Visit: Payer: Self-pay

## 2024-08-13 DIAGNOSIS — I10 Essential (primary) hypertension: Secondary | ICD-10-CM

## 2024-08-13 MED ORDER — LISINOPRIL-HYDROCHLOROTHIAZIDE 20-12.5 MG PO TABS
1.0000 | ORAL_TABLET | Freq: Every day | ORAL | 0 refills | Status: DC
  Filled 2024-08-13: qty 30, 30d supply, fill #0

## 2024-08-14 ENCOUNTER — Other Ambulatory Visit: Payer: Self-pay

## 2024-09-03 ENCOUNTER — Ambulatory Visit: Admitting: Family Medicine

## 2024-09-03 ENCOUNTER — Other Ambulatory Visit: Payer: Self-pay

## 2024-09-03 VITALS — BP 122/82 | HR 73 | Temp 97.9°F | Ht 65.0 in | Wt 316.8 lb

## 2024-09-03 DIAGNOSIS — I10 Essential (primary) hypertension: Secondary | ICD-10-CM

## 2024-09-03 DIAGNOSIS — R635 Abnormal weight gain: Secondary | ICD-10-CM

## 2024-09-03 DIAGNOSIS — K5909 Other constipation: Secondary | ICD-10-CM

## 2024-09-03 DIAGNOSIS — Z23 Encounter for immunization: Secondary | ICD-10-CM

## 2024-09-03 DIAGNOSIS — R7303 Prediabetes: Secondary | ICD-10-CM

## 2024-09-03 DIAGNOSIS — R6889 Other general symptoms and signs: Secondary | ICD-10-CM

## 2024-09-03 LAB — CBC WITH DIFFERENTIAL/PLATELET
Basophils Absolute: 0 10*3/uL (ref 0.0–0.1)
Basophils Relative: 1 % (ref 0.0–3.0)
Eosinophils Absolute: 0.1 10*3/uL (ref 0.0–0.7)
Eosinophils Relative: 1.3 % (ref 0.0–5.0)
HCT: 43.4 % (ref 36.0–46.0)
Hemoglobin: 14.6 g/dL (ref 12.0–15.0)
Lymphocytes Relative: 57.3 % — ABNORMAL HIGH (ref 12.0–46.0)
Lymphs Abs: 2.7 10*3/uL (ref 0.7–4.0)
MCHC: 33.6 g/dL (ref 30.0–36.0)
MCV: 83.4 fl (ref 78.0–100.0)
Monocytes Absolute: 0.4 10*3/uL (ref 0.1–1.0)
Monocytes Relative: 7.9 % (ref 3.0–12.0)
Neutro Abs: 1.5 10*3/uL (ref 1.4–7.7)
Neutrophils Relative %: 32.5 % — ABNORMAL LOW (ref 43.0–77.0)
Platelets: 260 10*3/uL (ref 150.0–400.0)
RBC: 5.21 Mil/uL — ABNORMAL HIGH (ref 3.87–5.11)
RDW: 14.4 % (ref 11.5–15.5)
WBC: 4.7 10*3/uL (ref 4.0–10.5)

## 2024-09-03 LAB — COMPREHENSIVE METABOLIC PANEL WITH GFR
ALT: 14 U/L (ref 3–35)
AST: 19 U/L (ref 5–37)
Albumin: 4.4 g/dL (ref 3.5–5.2)
Alkaline Phosphatase: 87 U/L (ref 39–117)
BUN: 15 mg/dL (ref 6–23)
CO2: 30 meq/L (ref 19–32)
Calcium: 9.8 mg/dL (ref 8.4–10.5)
Chloride: 98 meq/L (ref 96–112)
Creatinine, Ser: 0.67 mg/dL (ref 0.40–1.20)
GFR: 104.59 mL/min
Glucose, Bld: 94 mg/dL (ref 70–99)
Potassium: 3.9 meq/L (ref 3.5–5.1)
Sodium: 134 meq/L — ABNORMAL LOW (ref 135–145)
Total Bilirubin: 0.8 mg/dL (ref 0.2–1.2)
Total Protein: 8.2 g/dL (ref 6.0–8.3)

## 2024-09-03 LAB — MICROALBUMIN / CREATININE URINE RATIO
Creatinine,U: 109.9 mg/dL
Microalb Creat Ratio: UNDETERMINED mg/g (ref 0.0–30.0)
Microalb, Ur: <0.7 mg/dL

## 2024-09-03 LAB — HEMOGLOBIN A1C: Hgb A1c MFr Bld: 6.1 % (ref 4.6–6.5)

## 2024-09-03 LAB — VITAMIN B12: Vitamin B-12: 343 pg/mL (ref 211–911)

## 2024-09-03 LAB — TSH: TSH: 0.42 u[IU]/mL (ref 0.35–5.50)

## 2024-09-03 LAB — VITAMIN D 25 HYDROXY (VIT D DEFICIENCY, FRACTURES): VITD: 19.96 ng/mL — ABNORMAL LOW (ref 30.00–100.00)

## 2024-09-03 MED ORDER — VALSARTAN-HYDROCHLOROTHIAZIDE 80-12.5 MG PO TABS
1.0000 | ORAL_TABLET | Freq: Every day | ORAL | 3 refills | Status: AC
  Filled 2024-09-03: qty 90, 90d supply, fill #0

## 2024-09-03 NOTE — Progress Notes (Unsigned)
 "  New Patient Visit  Subjective:     Patient ID: Allison Dennis, female    DOB: 12-02-1975, 49 y.o.   MRN: [id number]  No chief complaint on file.   HPI  Discussed the use of AI scribe software for clinical note transcription with the patient, who gave verbal consent to proceed.  History of Present Illness Allison Dennis is a 49 year old female with hypertension who presents with concerns about her blood pressure medication and associated symptoms.  Antihypertensive medication side effects - Currently taking lisinopril  20 mg for hypertension. - Swelling of lips and persistent cough while on lisinopril . - No prior trials of other antihypertensive medications. - Monitors blood pressure at home without headaches, blurred vision, chest pain, or palpitations during elevated readings.  Lower extremity edema and weight gain - Bilateral leg and foot swelling for 3 to 4 months. - Weight increased from 280 lb to 316 lb over the same period.  Gastrointestinal symptoms - Constipation began approximately 3 to 4 months ago, concurrent with onset of edema. - Took an unknown pill for constipation from her cousin, resulting in watery stools and abdominal cramping.  Cold intolerance - Persistent sensation of feeling cold. - No known diagnosis of thyroid disease, anemia, or diabetes. - No known family history of thyroid disease.     ROS Per HPI  Outpatient Encounter Medications as of 09/03/2024  Medication Sig   cetirizine  (ZYRTEC ) 10 MG tablet Take 1 tablet (10 mg total) by mouth daily.   fluticasone  (FLONASE ) 50 MCG/ACT nasal spray Place 1 spray into both nostrils daily.   lisinopril -hydrochlorothiazide  (ZESTORETIC ) 20-12.5 MG tablet Take 1 tablet by mouth daily.   Vitamin D , Ergocalciferol , (DRISDOL ) 1.25 MG (50000 UNIT) CAPS capsule Take 1 capsule (50,000 Units total) by mouth every 7 (seven) days.   [DISCONTINUED] lisinopril  (PRINIVIL ,ZESTRIL ) 20 MG tablet TAKE 1 TABLET BY MOUTH EVERY  DAY FOR BLOOD PRESSURE (Patient not taking: No sig reported)   No facility-administered encounter medications on file as of 09/03/2024.    Past Medical History:  Diagnosis Date   Anemia    history   Hypertension    SVD (spontaneous vaginal delivery)    x 3 - 1 set of twins    Past Surgical History:  Procedure Laterality Date   CESAREAN SECTION     x 1   DILITATION & CURRETTAGE/HYSTROSCOPY WITH HYDROTHERMAL ABLATION N/A 12/18/2013   Procedure: DILATATION & CURETTAGE/HYSTEROSCOPY WITH HYDROTHERMAL ABLATION;  Surgeon: Carlin DELENA Centers, MD;  Location: [facility];  Service: Gynecology;  Laterality: N/A;   TONSILLECTOMY     TUBAL LIGATION Bilateral    WISDOM TOOTH EXTRACTION      Family History  Problem Relation Age of Onset   Breast cancer Mother    Ovarian cancer Mother    Lung cancer Mother    Hypertension Father    Diabetes Father    Stomach cancer Paternal Grandmother     Social History   Socioeconomic History   Marital status: Married    Spouse name: Not on file   Number of children: Not on file   Years of education: Not on file   Highest education level: Not on file  Occupational History   Not on file  Tobacco Use   Smoking status: Never   Smokeless tobacco: Never  Vaping Use   Vaping status: Never Used  Substance and Sexual Activity   Alcohol use: Yes    Comment: Socially    Drug use: No  Sexual activity: Not Currently    Partners: Male    Birth control/protection: Surgical  Other Topics Concern   Not on file  Social History Narrative   Not on file   Social Drivers of Health   Tobacco Use: Low Risk (11/27/2023)   Patient History    Smoking Tobacco Use: Never    Smokeless Tobacco Use: Never    Passive Exposure: Not on file  Financial Resource Strain: Not on file  Food Insecurity: Not on file  Transportation Needs: Not on file  Physical Activity: Not on file  Stress: Not on file  Social Connections: Not on file  Intimate Partner Violence: Not on  file  Depression (PHQ2-9): Low Risk (11/27/2023)   Depression (PHQ2-9)    PHQ-2 Score: 0  Alcohol Screen: Not on file  Housing: Not on file  Utilities: Not on file  Health Literacy: Not on file       Objective:    There were no vitals taken for this visit.   Physical Exam Vitals and nursing note reviewed.  Constitutional:      General: She is not in acute distress.    Appearance: Normal appearance. She is normal weight.  HENT:     Head: Normocephalic and atraumatic.     Right Ear: External ear normal.     Left Ear: External ear normal.     Nose: Nose normal.     Mouth/Throat:     Mouth: Mucous membranes are moist.     Pharynx: Oropharynx is clear.  Eyes:     Extraocular Movements: Extraocular movements intact.     Pupils: Pupils are equal, round, and reactive to light.  Cardiovascular:     Rate and Rhythm: Normal rate and regular rhythm.     Pulses: Normal pulses.     Heart sounds: Normal heart sounds.  Pulmonary:     Effort: Pulmonary effort is normal. No respiratory distress.     Breath sounds: Normal breath sounds. No wheezing, rhonchi or rales.  Musculoskeletal:        General: Normal range of motion.     Cervical back: Normal range of motion.     Right lower leg: No edema.     Left lower leg: No edema.  Lymphadenopathy:     Cervical: No cervical adenopathy.  Neurological:     General: No focal deficit present.     Mental Status: She is alert and oriented to person, place, and time.  Psychiatric:        Mood and Affect: Mood normal.        Thought Content: Thought content normal.     No results found for any visits on 09/03/24.      Assessment & Plan:   Assessment and Plan Assessment & Plan Essential hypertension Lisinopril  discontinued due to angioedema and cough. - Initiated valsartan  80 mg with HCTZ. - Instructed to monitor blood pressure at home 3-4 times a week for 1 month. - Educated on compression socks for swelling.  Chronic  constipation Constipation for 3-4 months with leg swelling and weight gain. Previous laxative use caused watery stools and cramping. - Recommended daily 4 oz prune juice, 4 oz apple juice, and half capful Miralax, heated. - Advised to increase to twice daily if no improvement after 3 days. - Ordered thyroid function tests, vitamin B12, and vitamin D  levels.  Morbid obesity with abnormal weight gain Weight gain of 316 pounds over 3-4 months with constipation and leg swelling. - Ordered thyroid  function tests.  Cold intolerance Cold intolerance coinciding with weight gain and constipation. - Ordered thyroid function tests.     No orders of the defined types were placed in this encounter.    No orders of the defined types were placed in this encounter.   No follow-ups on file.  Corean LITTIE Ku, FNP   "

## 2024-09-03 NOTE — Patient Instructions (Addendum)
 Welcome to [facility]!  Thank you for choosing us  for your Primary Care needs.   We offer in person and video appointments for your convenience. You may call our office to schedule appointments, or you may schedule appointments with me through MyChart.   The best way to get in contact with me is via MyChart message. This will get to me faster than a phone call, unless there is an emergency, then please call 911.  The lab is located downstairs in the Sports Medicine building, we also have xray available there.   4 oz prune juice, 4 oz apple juice 1 cap miralax Heat and drink, may do this twice a day as needed for constipation

## 2024-09-10 ENCOUNTER — Ambulatory Visit: Payer: Self-pay | Admitting: Family Medicine
